# Patient Record
Sex: Male | Born: 1960 | Race: White | Hispanic: No | Marital: Married | Smoking: Former smoker
Health system: Southern US, Community
[De-identification: ages and names within clinical notes are randomized; demographics above are authoritative.]

## PROBLEM LIST (undated history)

## (undated) DIAGNOSIS — M109 Gout, unspecified: Secondary | ICD-10-CM

## (undated) DIAGNOSIS — E119 Type 2 diabetes mellitus without complications: Secondary | ICD-10-CM

## (undated) DIAGNOSIS — M1990 Unspecified osteoarthritis, unspecified site: Secondary | ICD-10-CM

## (undated) DIAGNOSIS — Z87442 Personal history of urinary calculi: Secondary | ICD-10-CM

## (undated) DIAGNOSIS — I10 Essential (primary) hypertension: Secondary | ICD-10-CM

## (undated) HISTORY — PX: HAND SURGERY: SHX662

## (undated) HISTORY — DX: Type 2 diabetes mellitus without complications: E11.9

## (undated) HISTORY — DX: Gout, unspecified: M10.9

## (undated) HISTORY — DX: Essential (primary) hypertension: I10

---

## 2000-04-01 ENCOUNTER — Encounter: Payer: Self-pay | Admitting: Orthopedic Surgery

## 2000-04-01 ENCOUNTER — Encounter: Payer: Self-pay | Admitting: Emergency Medicine

## 2000-04-01 ENCOUNTER — Emergency Department (HOSPITAL_COMMUNITY): Admission: EM | Admit: 2000-04-01 | Discharge: 2000-04-01 | Payer: Self-pay | Admitting: Emergency Medicine

## 2001-10-05 ENCOUNTER — Encounter: Payer: Self-pay | Admitting: Family Medicine

## 2001-10-05 ENCOUNTER — Ambulatory Visit (HOSPITAL_COMMUNITY): Admission: RE | Admit: 2001-10-05 | Discharge: 2001-10-05 | Payer: Self-pay | Admitting: Family Medicine

## 2014-09-01 ENCOUNTER — Telehealth: Payer: Self-pay | Admitting: Family Medicine

## 2014-09-01 NOTE — Telephone Encounter (Signed)
APPT MADE FOR CPE, PT WILL GO TO URGENT CARE TODAY FOR WEIGHT LOSS AND FATIGUE. NOT TAKING ANY MEDS.

## 2014-09-08 ENCOUNTER — Telehealth: Payer: Self-pay | Admitting: Family Medicine

## 2014-09-11 NOTE — Telephone Encounter (Signed)
STP he was dx'd with diabetes at the urgent care after losing 50 lbs in one year, they started him on metformin and wanted him to follow up with pcp, [facility]03/14 @ 2:30

## 2014-10-03 ENCOUNTER — Ambulatory Visit (INDEPENDENT_AMBULATORY_CARE_PROVIDER_SITE_OTHER): Payer: BC Managed Care – PPO | Admitting: Family Medicine

## 2014-10-03 ENCOUNTER — Encounter: Payer: Self-pay | Admitting: Family Medicine

## 2014-10-03 ENCOUNTER — Encounter (INDEPENDENT_AMBULATORY_CARE_PROVIDER_SITE_OTHER): Payer: Self-pay

## 2014-10-03 VITALS — BP 132/84 | HR 74 | Temp 97.9°F | Ht 65.0 in | Wt 191.4 lb

## 2014-10-03 DIAGNOSIS — E119 Type 2 diabetes mellitus without complications: Secondary | ICD-10-CM

## 2014-10-03 NOTE — Progress Notes (Signed)
   Subjective:    Patient ID: Antonio Fisher, male    DOB: August 13, 1961, 53 y.o.   MRN: [id number]  HPI 53 year old gentleman here to follow-up diagnosis of diabetes made at urgent care one month ago. He went to the urgent care with weight loss and was found to have an elevated sugar. Don't note any other labs that he had done that day but he was given a prescription for metformin. Over the previous year he had lost about 50 pounds weight and was having polyuria polydipsia along with visual changes. There is a positive family history in his father. Since starting on metformin he feels much better and has been observing lifestyle modifications including eating of sweets and reduced carbohydrates.    Review of Systems  Constitutional: Negative.   HENT: Negative.   Eyes: Negative.   Respiratory: Negative.  Negative for shortness of breath.   Cardiovascular: Negative.  Negative for chest pain and leg swelling.  Gastrointestinal: Negative.   Genitourinary: Negative.   Musculoskeletal: Negative.   Skin: Negative.   Neurological: Negative.   Psychiatric/Behavioral: Negative.   All other systems reviewed and are negative.      Objective:   Physical Exam  Constitutional: He is oriented to person, place, and time. He appears well-developed and well-nourished.  HENT:  Head: Normocephalic.  Right Ear: External ear normal.  Left Ear: External ear normal.  Nose: Nose normal.  Mouth/Throat: Oropharynx is clear and moist.  Eyes: Conjunctivae and EOM are normal. Pupils are equal, round, and reactive to light.  Neck: Normal range of motion. Neck supple.  Cardiovascular: Normal rate, regular rhythm, normal heart sounds and intact distal pulses.   Pulmonary/Chest: Effort normal and breath sounds normal.  Abdominal: Soft. Bowel sounds are normal.  Musculoskeletal: Normal range of motion.  Neurological: He is alert and oriented to person, place, and time.  Skin: Skin is warm and dry.    Psychiatric: He has a normal mood and affect. His behavior is normal. Judgment and thought content normal.    BP 132/84 mmHg  Pulse 74  Temp(Src) 97.9 F (36.6 C) (Oral)  Ht 5\' 5"  (1.651 m)  Wt 191 lb 6.4 oz (86.818 kg)  BMI 31.85 kg/m2      Assessment & Plan:  1. Type 2 diabetes mellitus without complication     Given above sx, I suspect his diagnosis is accurate.  He seems motivates to treat.  Gave info on food and glucometer; do not want to overwhelm him with i nfo but will gradually introduce more education.  He is to return end of Dec for physocal' need A1C, renal function, lipids then  Frederica KusterStephen M Joelyn Lover MD

## 2014-10-03 NOTE — Patient Instructions (Signed)
Diabetes Mellitus and Food It is important for you to manage your blood sugar (glucose) level. Your blood glucose level can be greatly affected by what you eat. Eating healthier foods in the appropriate amounts throughout the day at about the same time each day will help you control your blood glucose level. It can also help slow or prevent worsening of your diabetes mellitus. Healthy eating may even help you improve the level of your blood pressure and reach or maintain a healthy weight.  HOW CAN FOOD AFFECT ME? Carbohydrates Carbohydrates affect your blood glucose level more than any other type of food. Your dietitian will help you determine how many carbohydrates to eat at each meal and teach you how to count carbohydrates. Counting carbohydrates is important to keep your blood glucose at a healthy level, especially if you are using insulin or taking certain medicines for diabetes mellitus. Alcohol Alcohol can cause sudden decreases in blood glucose (hypoglycemia), especially if you use insulin or take certain medicines for diabetes mellitus. Hypoglycemia can be a life-threatening condition. Symptoms of hypoglycemia (sleepiness, dizziness, and disorientation) are similar to symptoms of having too much alcohol.  If your health care provider has given you approval to drink alcohol, do so in moderation and use the following guidelines:  Women should not have more than one drink per day, and men should not have more than two drinks per day. One drink is equal to:  12 oz of beer.  5 oz of wine.  1 oz of hard liquor.  Do not drink on an empty stomach.  Keep yourself hydrated. Have water, diet soda, or unsweetened iced tea.  Regular soda, juice, and other mixers might contain a lot of carbohydrates and should be counted. WHAT FOODS ARE NOT RECOMMENDED? As you make food choices, it is important to remember that all foods are not the same. Some foods have fewer nutrients per serving than other  foods, even though they might have the same number of calories or carbohydrates. It is difficult to get your body what it needs when you eat foods with fewer nutrients. Examples of foods that you should avoid that are high in calories and carbohydrates but low in nutrients include:  Trans fats (most processed foods list trans fats on the Nutrition Facts label).  Regular soda.  Juice.  Candy.  Sweets, such as cake, pie, doughnuts, and cookies.  Fried foods. WHAT FOODS CAN I EAT? Have nutrient-rich foods, which will nourish your body and keep you healthy. The food you should eat also will depend on several factors, including:  The calories you need.  The medicines you take.  Your weight.  Your blood glucose level.  Your blood pressure level.  Your cholesterol level. You also should eat a variety of foods, including:  Protein, such as meat, poultry, fish, tofu, nuts, and seeds (lean animal proteins are best).  Fruits.  Vegetables.  Dairy products, such as milk, cheese, and yogurt (low fat is best).  Breads, grains, pasta, cereal, rice, and beans.  Fats such as olive oil, trans fat-free margarine, canola oil, avocado, and olives. DOES EVERYONE WITH DIABETES MELLITUS HAVE THE SAME MEAL PLAN? Because every person with diabetes mellitus is different, there is not one meal plan that works for everyone. It is very important that you meet with a dietitian who will help you create a meal plan that is just right for you. Document Released: 07/28/2005 Document Revised: 11/05/2013 Document Reviewed: 09/27/2013 Exit[employer] Patient Information 2015 [employer]. This   information is not intended to replace advice given to you by your health care provider. Make sure you discuss any questions you have with your health care provider.  

## 2014-11-10 ENCOUNTER — Other Ambulatory Visit (INDEPENDENT_AMBULATORY_CARE_PROVIDER_SITE_OTHER): Payer: BC Managed Care – PPO

## 2014-11-10 DIAGNOSIS — E119 Type 2 diabetes mellitus without complications: Secondary | ICD-10-CM

## 2014-11-10 LAB — POCT GLYCOSYLATED HEMOGLOBIN (HGB A1C): Hemoglobin A1C: 8.6

## 2014-11-10 NOTE — Progress Notes (Signed)
Lab only 

## 2014-11-11 LAB — CMP14+EGFR
ALT: 21 IU/L (ref 0–44)
AST: 18 IU/L (ref 0–40)
Albumin/Globulin Ratio: 2.3 (ref 1.1–2.5)
Albumin: 4.3 g/dL (ref 3.5–5.5)
Alkaline Phosphatase: 66 IU/L (ref 39–117)
BUN/Creatinine Ratio: 17 (ref 9–20)
BUN: 13 mg/dL (ref 6–24)
CO2: 23 mmol/L (ref 18–29)
Calcium: 9.1 mg/dL (ref 8.7–10.2)
Chloride: 99 mmol/L (ref 97–108)
Creatinine, Ser: 0.75 mg/dL — ABNORMAL LOW (ref 0.76–1.27)
GFR calc Af Amer: 122 mL/min/{1.73_m2} (ref >59)
GFR calc non Af Amer: 105 mL/min/{1.73_m2} (ref >59)
Globulin, Total: 1.9 g/dL (ref 1.5–4.5)
Glucose: 248 mg/dL — ABNORMAL HIGH (ref 65–99)
Potassium: 4.3 mmol/L (ref 3.5–5.2)
Sodium: 140 mmol/L (ref 134–144)
Total Bilirubin: 0.7 mg/dL (ref 0.0–1.2)
Total Protein: 6.2 g/dL (ref 6.0–8.5)

## 2014-11-12 ENCOUNTER — Ambulatory Visit (INDEPENDENT_AMBULATORY_CARE_PROVIDER_SITE_OTHER): Payer: BC Managed Care – PPO | Admitting: Family Medicine

## 2014-11-12 ENCOUNTER — Encounter: Payer: Self-pay | Admitting: Family Medicine

## 2014-11-12 VITALS — BP 137/80 | HR 74 | Temp 97.1°F | Ht 65.0 in | Wt 195.4 lb

## 2014-11-12 DIAGNOSIS — E119 Type 2 diabetes mellitus without complications: Secondary | ICD-10-CM | POA: Insufficient documentation

## 2014-11-12 MED ORDER — SILDENAFIL CITRATE 20 MG PO TABS
20.0000 mg | ORAL_TABLET | Freq: Three times a day (TID) | ORAL | Status: DC
Start: 2014-11-12 — End: 2014-11-13

## 2014-11-12 MED ORDER — METFORMIN HCL 1000 MG PO TABS
1000.0000 mg | ORAL_TABLET | Freq: Two times a day (BID) | ORAL | Status: DC
Start: 2014-11-12 — End: 2015-03-03

## 2014-11-12 NOTE — Progress Notes (Signed)
   Subjective:    Patient ID: Antonio Fisher, male    DOB: 08/16/1961, 53 y.o.   MRN: [id number]  HPI 53 year old gentleman here to follow-up diabetes. He was diagnosed at urgent care several months ago and his first visit here was little over one month ago area he was given information about diet and started on metformin 500 mg twice a day. A1c was 8.6 and fasting glucose was 248 otherwise his renal function liver functions and electrolytes were all normal. I really think he is taking the diabetes diagnosis seriously and is trying to diet and watches foods. His weight has not changed since he lost a great deal of weight prior to the diagnosis. He does have an issue today with erectile dysfunction and we talked about that as relates to diabetes and vascular complications.    Review of Systems  Constitutional: Negative.   HENT: Negative.   Eyes: Negative.   Respiratory: Negative.  Negative for shortness of breath.   Cardiovascular: Negative.  Negative for chest pain and leg swelling.  Gastrointestinal: Negative.   Genitourinary: Negative.   Musculoskeletal: Negative.   Skin: Negative.   Neurological: Negative.   Psychiatric/Behavioral: Negative.   All other systems reviewed and are negative.      Objective:   Physical Exam  Constitutional: He is oriented to person, place, and time. He appears well-developed and well-nourished.  HENT:  Head: Normocephalic.  Right Ear: External ear normal.  Left Ear: External ear normal.  Nose: Nose normal.  Mouth/Throat: Oropharynx is clear and moist.  Eyes: Conjunctivae and EOM are normal. Pupils are equal, round, and reactive to light.  Neck: Normal range of motion. Neck supple.  Cardiovascular: Normal rate, regular rhythm, normal heart sounds and intact distal pulses.   Pulmonary/Chest: Effort normal and breath sounds normal.  Abdominal: Soft. Bowel sounds are normal.  Genitourinary: Prostate normal.  Musculoskeletal: Normal range of  motion.  Neurological: He is alert and oriented to person, place, and time.  Skin: Skin is warm and dry.  Psychiatric: He has a normal mood and affect. His behavior is normal. Judgment and thought content normal.          Assessment & Plan:  1. Type 2 diabetes mellitus without complication Given more education today about DM.  Increase metformin to 1000 mg BID 2. ED  Rx: Sildenafil 20 mg  Frederica KusterStephen M Miller MD

## 2014-11-12 NOTE — Patient Instructions (Signed)
Blood Glucose Monitoring °Monitoring your blood glucose (also know as blood sugar) helps you to manage your diabetes. It also helps you and your health care provider monitor your diabetes and determine how well your treatment plan is working. °WHY SHOULD YOU MONITOR YOUR BLOOD GLUCOSE? °· It can help you understand how food, exercise, and medicine affect your blood glucose. °· It allows you to know what your blood glucose is at any given moment. You can quickly tell if you are having low blood glucose (hypoglycemia) or high blood glucose (hyperglycemia). °· It can help you and your health care provider know how to adjust your medicines. °· It can help you understand how to manage an illness or adjust medicine for exercise. °WHEN SHOULD YOU TEST? °Your health care provider will help you decide how often you should check your blood glucose. This may depend on the type of diabetes you have, your diabetes control, or the types of medicines you are taking. Be sure to write down all of your blood glucose readings so that this information can be reviewed with your health care provider. See below for examples of testing times that your health care provider may suggest. °Type 1 Diabetes °· Test 4 times a day if you are in good control, using an insulin pump, or perform multiple daily injections. °· If your diabetes is not well controlled or if you are sick, you may need to monitor more often. °· It is a good idea to also monitor: °¨ Before and after exercise. °¨ Between meals and 2 hours after a meal. °¨ Occasionally between 2:00 a.m. and 3:00 a.m. °Type 2 Diabetes °· It can vary with each person, but generally, if you are on insulin, test 4 times a day. °· If you take medicines by mouth (orally), test 2 times a day. °· If you are on a controlled diet, test once a day. °· If your diabetes is not well controlled or if you are sick, you may need to monitor more often. °HOW TO MONITOR YOUR BLOOD GLUCOSE °Supplies  Needed °· Blood glucose meter. °· Test strips for your meter. Each meter has its own strips. You must use the strips that go with your own meter. °· A pricking needle (lancet). °· A device that holds the lancet (lancing device). °· A journal or log book to write down your results. °Procedure °· Wash your hands with soap and water. Alcohol is not preferred. °· Prick the side of your finger (not the tip) with the lancet. °· Gently milk the finger until a small drop of blood appears. °· Follow the instructions that come with your meter for inserting the test strip, applying blood to the strip, and using your blood glucose meter. °Other Areas to Get Blood for Testing °Some meters allow you to use other areas of your body (other than your finger) to test your blood. These areas are called alternative sites. The most common alternative sites are: °· The forearm. °· The thigh. °· The back area of the lower leg. °· The palm of the hand. °The blood flow in these areas is slower. Therefore, the blood glucose values you get may be delayed, and the numbers are different from what you would get from your fingers. Do not use alternative sites if you think you are having hypoglycemia. Your reading will not be accurate. Always use a finger if you are having hypoglycemia. Also, if you cannot feel your lows (hypoglycemia unawareness), always use your fingers for your   blood glucose checks. °ADDITIONAL TIPS FOR GLUCOSE MONITORING °· Do not reuse lancets. °· Always carry your supplies with you. °· All blood glucose meters have a 24-hour "hotline" number to call if you have questions or need help. °· Adjust (calibrate) your blood glucose meter with a control solution after finishing a few boxes of strips. °BLOOD GLUCOSE RECORD KEEPING °It is a good idea to keep a daily record or log of your blood glucose readings. Most glucose meters, if not all, keep your glucose records stored in the meter. Some meters come with the ability to download  your records to your home computer. Keeping a record of your blood glucose readings is especially helpful if you are wanting to look for patterns. Make notes to go along with the blood glucose readings because you might forget what happened at that exact time. Keeping good records helps you and your health care provider to work together to achieve good diabetes management.  °Document Released: 11/03/2003 Document Revised: 03/17/2014 Document Reviewed: 03/25/2013 °ExitCare® Patient Information ©2015 [employer]. This information is not intended to replace advice given to you by your health care provider. Make sure you discuss any questions you have with your health care provider. ° °Diabetes and Exercise °Exercising regularly is important. It is not just about losing weight. It has many health benefits, such as: °· Improving your overall fitness, flexibility, and endurance. °· Increasing your bone density. °· Helping with weight control. °· Decreasing your body fat. °· Increasing your muscle strength. °· Reducing stress and tension. °· Improving your overall health. °People with diabetes who exercise gain additional benefits because exercise: °· Reduces appetite. °· Improves the body's use of blood sugar (glucose). °· Helps lower or control blood glucose. °· Decreases blood pressure. °· Helps control blood lipids (such as cholesterol and triglycerides). °· Improves the body's use of the hormone insulin by: °¨ Increasing the body's insulin sensitivity. °¨ Reducing the body's insulin needs. °· Decreases the risk for heart disease because exercising: °¨ Lowers cholesterol and triglycerides levels. °¨ Increases the levels of good cholesterol (such as high-density lipoproteins [HDL]) in the body. °¨ Lowers blood glucose levels. °YOUR ACTIVITY PLAN  °Choose an activity that you enjoy and set realistic goals. Your health care provider or diabetes educator can help you make an activity plan that works for you. Exercise  regularly as directed by your health care provider. This includes: °· Performing resistance training twice a week such as push-ups, sit-ups, lifting weights, or using resistance bands. °· Performing 150 minutes of cardio exercises each week such as walking, running, or playing sports. °· Staying active and spending no more than 90 minutes at one time being inactive. °Even short bursts of exercise are good for you. Three 10-minute sessions spread throughout the day are just as beneficial as a single 30-minute session. Some exercise ideas include: °· Taking the dog for a walk. °· Taking the stairs instead of the elevator. °· Dancing to your favorite song. °· Doing an exercise video. °· Doing your favorite exercise with a friend. °RECOMMENDATIONS FOR EXERCISING WITH TYPE 1 OR TYPE 2 DIABETES  °· Check your blood glucose before exercising. If blood glucose levels are greater than 240 mg/dL, check for urine ketones. Do not exercise if ketones are present. °· Avoid injecting insulin into areas of the body that are going to be exercised. For example, avoid injecting insulin into: °¨ The arms when playing tennis. °¨ The legs when jogging. °· Keep a record of: °¨   Food intake before and after you exercise. °¨ Expected peak times of insulin action. °¨ Blood glucose levels before and after you exercise. °¨ The type and amount of exercise you have done. °· Review your records with your health care provider. Your health care provider will help you to develop guidelines for adjusting food intake and insulin amounts before and after exercising. °· If you take insulin or oral hypoglycemic agents, watch for signs and symptoms of hypoglycemia. They include: °¨ Dizziness. °¨ Shaking. °¨ Sweating. °¨ Chills. °¨ Confusion. °· Drink plenty of water while you exercise to prevent dehydration or heat stroke. Body water is lost during exercise and must be replaced. °· Talk to your health care provider before starting an exercise program to  make sure it is safe for you. Remember, almost any type of activity is better than none. °Document Released: 01/21/2004 Document Revised: 03/17/2014 Document Reviewed: 04/09/2013 °ExitCare® Patient Information ©2015 [employer]. This information is not intended to replace advice given to you by your health care provider. Make sure you discuss any questions you have with your health care provider. ° °

## 2014-11-13 ENCOUNTER — Other Ambulatory Visit: Payer: Self-pay | Admitting: *Deleted

## 2014-11-13 MED ORDER — SILDENAFIL CITRATE 20 MG PO TABS: ORAL_TABLET | ORAL | Status: DC

## 2014-11-21 ENCOUNTER — Telehealth: Payer: Self-pay | Admitting: Family Medicine

## 2014-11-21 NOTE — Telephone Encounter (Signed)
Wife decided to call pharmacy first.

## 2014-12-02 ENCOUNTER — Encounter: Payer: Self-pay | Admitting: *Deleted

## 2015-01-09 ENCOUNTER — Telehealth: Payer: Self-pay | Admitting: *Deleted

## 2015-01-09 NOTE — Telephone Encounter (Signed)
Dr. Hyacinth MeekerMiller as we discussed ins co denied sildenafil on Cecilio and you wished me to do an appeal, they said that sidenafil is only used for  Pulmonary hypertension, would we have a better shot with viagra or cialis? I for sure don't know what I'm talking about but just a thought will do what you advise.  Thanks!

## 2015-02-13 ENCOUNTER — Other Ambulatory Visit (INDEPENDENT_AMBULATORY_CARE_PROVIDER_SITE_OTHER): Payer: BLUE CROSS/BLUE SHIELD

## 2015-02-13 DIAGNOSIS — E119 Type 2 diabetes mellitus without complications: Secondary | ICD-10-CM

## 2015-02-13 LAB — POCT GLYCOSYLATED HEMOGLOBIN (HGB A1C): Hemoglobin A1C: 8.1

## 2015-02-13 NOTE — Progress Notes (Signed)
Lab only 

## 2015-02-14 LAB — LIPID PANEL
CHOL/HDL RATIO: 5.9 ratio — AB (ref 0.0–5.0)
CHOLESTEROL TOTAL: 208 mg/dL — AB (ref 100–199)
HDL: 35 mg/dL — AB (ref >39)
Triglycerides: 451 mg/dL — ABNORMAL HIGH (ref 0–149)

## 2015-02-14 LAB — CMP14+EGFR
ALT: 25 IU/L (ref 0–44)
AST: 18 IU/L (ref 0–40)
Albumin/Globulin Ratio: 1.8 (ref 1.1–2.5)
Albumin: 4.4 g/dL (ref 3.5–5.5)
Alkaline Phosphatase: 67 IU/L (ref 39–117)
BUN/Creatinine Ratio: 15 (ref 9–20)
BUN: 11 mg/dL (ref 6–24)
Bilirubin Total: 0.4 mg/dL (ref 0.0–1.2)
CO2: 25 mmol/L (ref 18–29)
Calcium: 9.6 mg/dL (ref 8.7–10.2)
Chloride: 101 mmol/L (ref 97–108)
Creatinine, Ser: 0.71 mg/dL — ABNORMAL LOW (ref 0.76–1.27)
GFR calc Af Amer: 125 mL/min/{1.73_m2} (ref >59)
GFR calc non Af Amer: 108 mL/min/{1.73_m2} (ref >59)
Globulin, Total: 2.4 g/dL (ref 1.5–4.5)
Glucose: 225 mg/dL — ABNORMAL HIGH (ref 65–99)
Potassium: 4.5 mmol/L (ref 3.5–5.2)
Sodium: 142 mmol/L (ref 134–144)
Total Protein: 6.8 g/dL (ref 6.0–8.5)

## 2015-02-17 ENCOUNTER — Encounter: Payer: Self-pay | Admitting: Family Medicine

## 2015-02-17 ENCOUNTER — Ambulatory Visit (INDEPENDENT_AMBULATORY_CARE_PROVIDER_SITE_OTHER): Payer: BLUE CROSS/BLUE SHIELD | Admitting: Family Medicine

## 2015-02-17 VITALS — BP 126/85 | HR 90 | Temp 97.6°F | Ht 65.0 in | Wt 188.0 lb

## 2015-02-17 DIAGNOSIS — E119 Type 2 diabetes mellitus without complications: Secondary | ICD-10-CM

## 2015-02-17 LAB — POCT UA - MICROALBUMIN: Microalbumin Ur, POC: NEGATIVE mg/L

## 2015-02-17 MED ORDER — SILDENAFIL CITRATE 100 MG PO TABS: ORAL_TABLET | ORAL | Status: DC

## 2015-02-17 MED ORDER — GLIMEPIRIDE 4 MG PO TABS: 4.0000 mg | ORAL_TABLET | Freq: Every day | ORAL | Status: DC

## 2015-02-17 NOTE — Progress Notes (Signed)
   Subjective:    Patient ID: Antonio Fisher, male    DOB: 1961/07/19, 54 y.o.   MRN: [id number]  HPI 54 year old gentleman here to follow-up diabetes and erectile dysfunction. Currently takes metformin 1000 mg twice a day. That will was increased at his last visit and his A1c has decreased from 8.6-8.1. He tells me he is doing better in terms of his dietary compliance and avoidance of carbohydrates.  At his last visit I had ordered generic sildenafil for erectile dysfunction. He did not think this was effective and is requesting trade name Viagra or Cialis  Patient Active Problem List   Diagnosis Date Noted  . Type 2 diabetes mellitus 11/12/2014   Outpatient Encounter Prescriptions as of 02/17/2015  Medication Sig  . metFORMIN (GLUCOPHAGE) 1000 MG tablet Take 1 tablet (1,000 mg total) by mouth 2 (two) times daily with a meal.  . sildenafil (REVATIO) 20 MG tablet Take one tab po PRN prior to sexual activity      Review of Systems  Constitutional: Negative.   HENT: Negative.   Respiratory: Negative.   Cardiovascular: Negative.   Endocrine: Positive for polyuria.  Musculoskeletal: Negative.   Neurological: Negative.   Psychiatric/Behavioral: Negative.        Objective:   Physical Exam  Constitutional: He is oriented to person, place, and time. He appears well-developed and well-nourished.  HENT:  Head: Normocephalic.  Right Ear: External ear normal.  Left Ear: External ear normal.  Nose: Nose normal.  Mouth/Throat: Oropharynx is clear and moist.  Eyes: Conjunctivae and EOM are normal. Pupils are equal, round, and reactive to light.  Neck: Normal range of motion. Neck supple.  Cardiovascular: Normal rate, regular rhythm, normal heart sounds and intact distal pulses.   Pulmonary/Chest: Effort normal and breath sounds normal.  Abdominal: Soft. Bowel sounds are normal.  Musculoskeletal: Normal range of motion.  Neurological: He is alert and oriented to person, place, and time.   Skin: Skin is warm and dry.  Psychiatric: He has a normal mood and affect. His behavior is normal. Judgment and thought content normal.     BP 126/85 mmHg  Pulse 90  Temp(Src) 97.6 F (36.4 C) (Oral)  Ht 5\' 5"  (1.651 m)  Wt 188 lb (85.276 kg)  BMI 31.28 kg/m2         Assessment & Plan:  1. Type 2 diabetes mellitus without complication Will add Amaryl 4 mg to current regimen of metformin 2000 mg a day. Will repeat A1c in 3 months. Also noted on his lab work that he had done several days prior to this visit triglycerides were markedly elevated. I suggested he take at least 2 g of facial and watch fat intake he may well need 5 bright  Frederica KusterStephen M Miller MD - POCT [facility] - Microalbumin

## 2015-02-18 ENCOUNTER — Telehealth: Payer: Self-pay | Admitting: Family Medicine

## 2015-02-19 ENCOUNTER — Ambulatory Visit: Payer: BC Managed Care – PPO | Admitting: Family Medicine

## 2015-02-19 ENCOUNTER — Telehealth: Payer: Self-pay | Admitting: *Deleted

## 2015-02-19 NOTE — Telephone Encounter (Signed)
Pt notified of results

## 2015-02-19 NOTE — Telephone Encounter (Signed)
Insurance will not cover viagra and will only cover cialsis can you please change

## 2015-02-19 NOTE — Telephone Encounter (Signed)
-----   Message from Frederica KusterStephen M Miller, MD sent at 02/18/2015 12:51 PM EDT ----- Patient is aware of A1c result in the new medication was ordered blood chemistries are normal except for elevated glucose

## 2015-02-20 MED ORDER — TADALAFIL 10 MG PO TABS: 10.0000 mg | ORAL_TABLET | ORAL | Status: DC | PRN

## 2015-02-20 NOTE — Telephone Encounter (Signed)
Left detailed message that [facility]

## 2015-02-20 NOTE — Telephone Encounter (Signed)
Okay to change to Cialis

## 2015-03-03 ENCOUNTER — Telehealth: Payer: Self-pay | Admitting: Family Medicine

## 2015-03-03 MED ORDER — METFORMIN HCL 1000 MG PO TABS: 1000.0000 mg | ORAL_TABLET | Freq: Two times a day (BID) | ORAL | Status: DC

## 2015-03-03 NOTE — Telephone Encounter (Signed)
done

## 2015-04-27 ENCOUNTER — Telehealth: Payer: Self-pay | Admitting: *Deleted

## 2015-04-27 MED ORDER — GLIMEPIRIDE 4 MG PO TABS: 4.0000 mg | ORAL_TABLET | Freq: Every day | ORAL | Status: DC

## 2015-04-27 NOTE — Telephone Encounter (Signed)
Wait until Wednesday

## 2015-04-27 NOTE — Telephone Encounter (Signed)
Received fax from [facility] requesting a refill on pts indomethacin 50. Do not see on patients current med list. Please advise. Patient of Dr Hyacinth Meeker and he is not back in office till Wednesday. Please route to [facility]

## 2015-04-27 NOTE — Telephone Encounter (Signed)
Dr Hyacinth Meeker please review and route to [facility]

## 2015-04-29 ENCOUNTER — Other Ambulatory Visit: Payer: Self-pay

## 2015-04-29 NOTE — Telephone Encounter (Signed)
Last seen 02/17/15 Dr Hyacinth Meeker  This med not on EPIC list

## 2015-04-30 MED ORDER — INDOMETHACIN 50 MG PO CAPS: ORAL_CAPSULE | ORAL | Status: DC

## 2015-06-16 ENCOUNTER — Other Ambulatory Visit: Payer: Self-pay | Admitting: Family Medicine

## 2015-07-16 ENCOUNTER — Ambulatory Visit (INDEPENDENT_AMBULATORY_CARE_PROVIDER_SITE_OTHER): Payer: BLUE CROSS/BLUE SHIELD | Admitting: Family Medicine

## 2015-07-16 ENCOUNTER — Encounter (INDEPENDENT_AMBULATORY_CARE_PROVIDER_SITE_OTHER): Payer: Self-pay

## 2015-07-16 ENCOUNTER — Encounter: Payer: Self-pay | Admitting: Family Medicine

## 2015-07-16 VITALS — BP 130/83 | HR 63 | Temp 97.2°F | Ht 65.0 in | Wt 198.2 lb

## 2015-07-16 DIAGNOSIS — E119 Type 2 diabetes mellitus without complications: Secondary | ICD-10-CM | POA: Diagnosis not present

## 2015-07-16 LAB — POCT GLYCOSYLATED HEMOGLOBIN (HGB A1C): Hemoglobin A1C: 5.6

## 2015-07-16 MED ORDER — GLIMEPIRIDE 4 MG PO TABS: ORAL_TABLET | ORAL | Status: DC

## 2015-07-16 MED ORDER — LISINOPRIL 5 MG PO TABS: 5.0000 mg | ORAL_TABLET | Freq: Every day | ORAL | Status: DC

## 2015-07-16 MED ORDER — METFORMIN HCL 1000 MG PO TABS: ORAL_TABLET | ORAL | Status: DC

## 2015-07-16 NOTE — Progress Notes (Signed)
   Subjective:    Patient ID: Antonio Fisher, male    DOB: Jun 01, 1961, 54 y.o.   MRN: [id number]  HPI 54 year old gentleman who is here to follow-up diabetes. He has gained 10 pounds since his last visit 4 months ago. He tends to like ice cream. Last A1c was 8.1. Medications include metformin and glimepiride. He has not been on an ACE inhibitor for renal protection. Her pressures of been generally less than 130 systolic. He has no other complaints or symptoms today  Patient Active Problem List   Diagnosis Date Noted  . Type 2 diabetes mellitus 11/12/2014   Outpatient Encounter Prescriptions as of 07/16/2015  Medication Sig  . glimepiride (AMARYL) 4 MG tablet TAKE 1 TABLET (4 MG TOTAL) BY MOUTH DAILY BEFORE BREAKFAST.  . metFORMIN (GLUCOPHAGE) 1000 MG tablet TAKE 1 TABLET (1,000 MG TOTAL) BY MOUTH 2 (TWO) TIMES DAILY WITH A MEA L.  . indomethacin (INDOCIN) 50 MG capsule Take one capsule three times daily (Patient not taking: Reported on 07/16/2015)  . tadalafil (CIALIS) 10 MG tablet Take 1 tablet (10 mg total) by mouth every other day as needed for erectile dysfunction. (Patient not taking: Reported on 07/16/2015)  . [DISCONTINUED] sildenafil (REVATIO) 20 MG tablet Take one tab po PRN prior to sexual activity   No facility-administered encounter medications on file as of 07/16/2015.      Review of Systems  Constitutional: Negative.   HENT: Negative.   Eyes: Negative.   Respiratory: Negative.  Negative for shortness of breath.   Cardiovascular: Negative.  Negative for chest pain and leg swelling.  Gastrointestinal: Negative.   Genitourinary: Negative.   Musculoskeletal: Negative.   Skin: Negative.   Neurological: Negative.   Psychiatric/Behavioral: Negative.   All other systems reviewed and are negative.      Objective:   Physical Exam  Constitutional: He is oriented to person, place, and time. He appears well-developed and well-nourished.  Cardiovascular: Normal rate, regular rhythm  and intact distal pulses.   Pulmonary/Chest: Effort normal and breath sounds normal.  Neurological: He is alert and oriented to person, place, and time.  Skin: No rash noted.  Psychiatric: He has a normal mood and affect. His behavior is normal.          Assessment & Plan:  1. Type 2 diabetes mellitus without complication A1c is pending. I will add lisinopril 5 mg to his regimen for renal protection. Triglycerides are elevated back in April and could not calculate LDL but that would be an important thing to know in case we need to lower her lipids. - POCT glycosylated hemoglobin (Hb A1C)  Frederica Kuster MD - Lipid panel

## 2015-07-17 LAB — LIPID PANEL
CHOL/HDL RATIO: 4.5 ratio (ref 0.0–5.0)
Cholesterol, Total: 177 mg/dL (ref 100–199)
HDL: 39 mg/dL — AB (ref >39)
LDL Calculated: 117 mg/dL — ABNORMAL HIGH (ref 0–99)
TRIGLYCERIDES: 104 mg/dL (ref 0–149)
VLDL CHOLESTEROL CAL: 21 mg/dL (ref 5–40)

## 2015-07-21 NOTE — Progress Notes (Signed)
Patient's wife informed, states he is taking three fish oil a day now, so she will have him increase this to two in the morning and two in the evening.

## 2015-07-31 ENCOUNTER — Encounter: Payer: Self-pay | Admitting: Pediatrics

## 2015-07-31 ENCOUNTER — Ambulatory Visit (INDEPENDENT_AMBULATORY_CARE_PROVIDER_SITE_OTHER): Payer: BLUE CROSS/BLUE SHIELD | Admitting: Pediatrics

## 2015-07-31 VITALS — BP 132/86 | HR 71 | Temp 97.7°F | Ht 65.0 in | Wt 190.8 lb

## 2015-07-31 DIAGNOSIS — Z0000 Encounter for general adult medical examination without abnormal findings: Secondary | ICD-10-CM

## 2015-07-31 DIAGNOSIS — J069 Acute upper respiratory infection, unspecified: Secondary | ICD-10-CM | POA: Diagnosis not present

## 2015-07-31 DIAGNOSIS — J309 Allergic rhinitis, unspecified: Secondary | ICD-10-CM

## 2015-07-31 DIAGNOSIS — B9789 Other viral agents as the cause of diseases classified elsewhere: Principal | ICD-10-CM

## 2015-07-31 MED ORDER — FLUTICASONE PROPIONATE 50 MCG/ACT NA SUSP: 2.0000 | Freq: Every day | NASAL | Status: DC

## 2015-07-31 NOTE — Patient Instructions (Addendum)
--   ibuprofen  to  (3 or 4 tabs) every 8 hours for next five days -- flonase nasal spray one spray in each nostril morning and night -- can try guaiafenisin (mucinex) for the runny nose as well, will help dry things up.  -- allergy medicine cetirizine  you can also try once a day every day for allergies if you need more

## 2015-07-31 NOTE — Progress Notes (Signed)
Subjective:    Patient ID: Antonio Fisher, male    DOB: 02-20-1961, 54 y.o.   MRN: [id number]  CC: cough  HPI: Antonio Fisher is a 54 y.o. male presenting on 07/31/2015 for Cough  Started feeling ill 5 days ago, had to spend the day in bed a couple of days ago. First thought allergies. Eyes running, nose running, cough going for past day.  Not eating much because no appetite. No fevers. Does have muscle aches off and on.   Bailed hay for last two weeks. Feels better today. Ate some today for first time. Vomited once after eating oodles of noodles two weeks ago. No abd pain throughout any of this. Still coughing. Taking claritin, also taking a OTC cough syrup. Cough was keeping him up, OTC cough syrup helped.    Relevant past medical, surgical, family and social history reviewed and updated as indicated. Interim medical history since our last visit reviewed. Allergies and medications reviewed and updated.   ROS: Per HPI unless specifically indicated above  Past Medical History Patient Active Problem List   Diagnosis Date Noted  . Type 2 diabetes mellitus 11/12/2014    Current Outpatient Prescriptions  Medication Sig Dispense Refill  . glimepiride (AMARYL) 4 MG tablet TAKE 1 TABLET (4 MG TOTAL) BY MOUTH DAILY BEFORE BREAKFAST. 30 tablet 0  . indomethacin (INDOCIN) 50 MG capsule Take one capsule three times daily 30 capsule 1  . lisinopril (PRINIVIL,ZESTRIL) 5 MG tablet Take 1 tablet (5 mg total) by mouth daily. 90 tablet 3  . metFORMIN (GLUCOPHAGE) 1000 MG tablet TAKE 1 TABLET (1,000 MG TOTAL) BY MOUTH 2 (TWO) TIMES DAILY WITH A MEA L. 60 tablet 0  . tadalafil (CIALIS) 10 MG tablet Take 1 tablet (10 mg total) by mouth every other day as needed for erectile dysfunction. 10 tablet 0  . fluticasone (FLONASE) 50 MCG/ACT nasal spray Place 2 sprays into both nostrils daily. 16 g 6   No current facility-administered medications for this visit.       Objective:    BP 132/86 mmHg   Pulse 71  Temp(Src) 97.7 F (36.5 C) (Oral)  Ht  (1.651 m)  Wt 190 lb 12.8 oz (86.546 kg)  BMI 31.75 kg/m2  Wt Readings from Last 3 Encounters:  07/31/15 190 lb 12.8 oz (86.546 kg)  07/16/15 198 lb 3.2 oz (89.903 kg)  02/17/15 188 lb (85.276 kg)     Gen: NAD, alert, cooperative with exam, NCAT EYES: EOMI, no scleral injection or icterus ENT:  TMs pearly gray b/l, OP with some erythema. Clear nasal discharge present LYMPH: no cervical LAD CV: NRRR, normal S1/S2, no murmur, DP pulses 2+ b/l Resp: CTABL, no wheezes, normal WOB Abd: +BS, soft, NTND. no guarding or organomegaly Ext: No edema, warm Neuro: Alert and oriented, strength equal b/l UE and LE, coordination grossly normal MSK: normal muscle bulk     Assessment & Plan:   Antonio Fisher was seen today for cough, initial symptoms sound most like allergies though he likely also has a viral URI given systemic symptoms. Today feeling better. Continue symptomatic care. Start flonase.  Diagnoses and all orders for this visit:  Allergic rhinitis, unspecified allergic rhinitis type -     fluticasone (FLONASE) 50 MCG/ACT nasal spray; Place 2 sprays into both nostrils daily.  Viral URI symptomatic care as above   Follow up plan: Return if symptoms worsen or fail to improve.  Rex Kras, MD [facility] 07/31/2015,  9:52 AM

## 2015-08-18 ENCOUNTER — Other Ambulatory Visit: Payer: Self-pay | Admitting: Family Medicine

## 2015-08-26 ENCOUNTER — Other Ambulatory Visit: Payer: Self-pay | Admitting: Family Medicine

## 2015-09-24 ENCOUNTER — Telehealth: Payer: Self-pay | Admitting: Family Medicine

## 2015-09-25 NOTE — Telephone Encounter (Signed)
Tried calling patient again to let them know the copies were ready to pick up. No answer on either phone.

## 2015-09-25 NOTE — Telephone Encounter (Signed)
Tried to call patient on home and cell# to let him know his copies of his labs were ready. No answer on either phone.

## 2015-10-21 ENCOUNTER — Telehealth: Payer: Self-pay | Admitting: Family Medicine

## 2015-10-22 ENCOUNTER — Telehealth: Payer: Self-pay | Admitting: Family Medicine

## 2015-10-22 NOTE — Telephone Encounter (Signed)
DENIED 

## 2015-10-22 NOTE — Telephone Encounter (Signed)
NOTED

## 2015-10-26 ENCOUNTER — Other Ambulatory Visit: Payer: Self-pay | Admitting: Pediatrics

## 2015-11-17 ENCOUNTER — Other Ambulatory Visit (INDEPENDENT_AMBULATORY_CARE_PROVIDER_SITE_OTHER): Payer: BLUE CROSS/BLUE SHIELD

## 2015-11-17 DIAGNOSIS — E119 Type 2 diabetes mellitus without complications: Secondary | ICD-10-CM

## 2015-11-17 LAB — POCT GLYCOSYLATED HEMOGLOBIN (HGB A1C): HEMOGLOBIN A1C: 5.9

## 2015-11-17 NOTE — Progress Notes (Signed)
lab only.

## 2015-11-18 LAB — CMP14+EGFR
A/G RATIO: 1.9 (ref 1.1–2.5)
ALBUMIN: 4.4 g/dL (ref 3.5–5.5)
ALT: 22 IU/L (ref 0–44)
AST: 16 IU/L (ref 0–40)
Alkaline Phosphatase: 57 IU/L (ref 39–117)
BUN / CREAT RATIO: 13 (ref 9–20)
BUN: 12 mg/dL (ref 6–24)
Bilirubin Total: 0.5 mg/dL (ref 0.0–1.2)
CO2: 26 mmol/L (ref 18–29)
Calcium: 9.4 mg/dL (ref 8.7–10.2)
Chloride: 100 mmol/L (ref 96–106)
Creatinine, Ser: 0.89 mg/dL (ref 0.76–1.27)
GFR, EST AFRICAN AMERICAN: 113 mL/min/{1.73_m2} (ref >59)
GFR, EST NON AFRICAN AMERICAN: 98 mL/min/{1.73_m2} (ref >59)
GLOBULIN, TOTAL: 2.3 g/dL (ref 1.5–4.5)
GLUCOSE: 154 mg/dL — AB (ref 65–99)
POTASSIUM: 4.6 mmol/L (ref 3.5–5.2)
SODIUM: 142 mmol/L (ref 134–144)
Total Protein: 6.7 g/dL (ref 6.0–8.5)

## 2015-11-18 LAB — LIPID PANEL
CHOL/HDL RATIO: 5.3 ratio — AB (ref 0.0–5.0)
Cholesterol, Total: 195 mg/dL (ref 100–199)
HDL: 37 mg/dL — ABNORMAL LOW (ref >39)
LDL CALC: 111 mg/dL — AB (ref 0–99)
Triglycerides: 237 mg/dL — ABNORMAL HIGH (ref 0–149)
VLDL Cholesterol Cal: 47 mg/dL — ABNORMAL HIGH (ref 5–40)

## 2015-11-24 ENCOUNTER — Ambulatory Visit (INDEPENDENT_AMBULATORY_CARE_PROVIDER_SITE_OTHER): Payer: BLUE CROSS/BLUE SHIELD | Admitting: Family Medicine

## 2015-11-24 ENCOUNTER — Encounter: Payer: Self-pay | Admitting: Family Medicine

## 2015-11-24 VITALS — BP 143/89 | HR 80 | Temp 98.1°F | Ht 65.0 in | Wt 208.4 lb

## 2015-11-24 DIAGNOSIS — E119 Type 2 diabetes mellitus without complications: Secondary | ICD-10-CM

## 2015-11-24 NOTE — Progress Notes (Signed)
   Subjective:    Patient ID: Hessie DienerBobby D Auletta, male    DOB: 13-Mar-1961, 55 y.o.   MRN: [id number]  HPI 55 year old gentleman is here to follow-up diabetes, hypertension, and hyperlipidemia. Except for weight gain in the last 4 months of a 17 pounds he has done well. He had labs done earlier this month which showed A1c of 5.9 and LDL of 111. Renal function showed a creatinine clearance of 83.5. 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 I really cannot explain this given the fact that his A1c's are very good unless he is having some fluctuations but the average is good. The other problem is erectile dysfunction. He has tried several of the drugs in the class of Viagra without good effects. I shared with him of the problem may be more psychological than circulatory and we may want to refer him.  Patient Active Problem List   Diagnosis Date Noted  . Rhinitis, allergic 07/31/2015  . Type 2 diabetes mellitus (HCC) 11/12/2014   Outpatient Encounter Prescriptions as of 11/24/2015  Medication Sig  . fluticasone (FLONASE) 50 MCG/ACT nasal spray Place 2 sprays into both nostrils daily.  Marland Kitchen. glimepiride (AMARYL) 4 MG tablet TAKE 1 TABLET (4 MG TOTAL) BY MOUTH DAILY BEFORE BREAKFAST.  . indomethacin (INDOCIN) 50 MG capsule Take one capsule three times daily  . lisinopril (PRINIVIL,ZESTRIL) 5 MG tablet Take 1 tablet (5 mg total) by mouth daily.  . metFORMIN (GLUCOPHAGE) 1000 MG tablet TAKE ONE TABLET BY MOUTH TWICE A DAY WITH MEALS  . tadalafil (CIALIS) 10 MG tablet Take 1 tablet (10 mg total) by mouth every other day as needed for erectile dysfunction.   No facility-administered encounter medications on file as of 11/24/2015.      Review of Systems  Constitutional: Negative.   Respiratory: Negative.   Cardiovascular: Negative.   Genitourinary: Negative.   Neurological: Negative.        Objective:   Physical Exam  Constitutional: He is  oriented to person, place, and time. He appears well-developed and well-nourished.  Cardiovascular: Normal rate, regular rhythm, normal heart sounds and intact distal pulses.   Pulmonary/Chest: Effort normal and breath sounds normal.  Neurological: He is alert and oriented to person, place, and time.          Assessment & Plan:  1. Type 2 diabetes mellitus without complication, without long-term current use of insulin (HCC) A1c is excellent at 5.9. Medications include metformin and glimepiride. Blood pressure is a little elevated today so will increase lisinopril from 5-10 mg. He had been taking it only for renal protection previously.  Frederica KusterStephen M Miller MD

## 2015-12-14 ENCOUNTER — Telehealth: Payer: Self-pay | Admitting: Family Medicine

## 2015-12-14 MED ORDER — LISINOPRIL 10 MG PO TABS: 10.0000 mg | ORAL_TABLET | Freq: Every day | ORAL | Status: DC

## 2015-12-14 NOTE — Telephone Encounter (Signed)
rx sent to pharmacy per last office visit notes. Pt is aware.

## 2016-01-08 ENCOUNTER — Other Ambulatory Visit: Payer: Self-pay | Admitting: Family Medicine

## 2016-01-08 MED ORDER — METFORMIN HCL 1000 MG PO TABS: 1000.0000 mg | ORAL_TABLET | Freq: Two times a day (BID) | ORAL | Status: DC

## 2016-01-08 NOTE — Telephone Encounter (Signed)
done

## 2016-01-17 ENCOUNTER — Other Ambulatory Visit: Payer: Self-pay | Admitting: Family Medicine

## 2016-02-24 ENCOUNTER — Telehealth: Payer: Self-pay | Admitting: Family Medicine

## 2016-03-18 ENCOUNTER — Other Ambulatory Visit: Payer: BLUE CROSS/BLUE SHIELD

## 2016-03-18 DIAGNOSIS — E119 Type 2 diabetes mellitus without complications: Secondary | ICD-10-CM

## 2016-03-18 LAB — BAYER DCA HB A1C WAIVED: HB A1C: 6.1 % (ref <7.0)

## 2016-03-19 LAB — CMP14+EGFR
ALBUMIN: 4.6 g/dL (ref 3.5–5.5)
ALT: 22 IU/L (ref 0–44)
AST: 17 IU/L (ref 0–40)
Albumin/Globulin Ratio: 2 (ref 1.2–2.2)
Alkaline Phosphatase: 54 IU/L (ref 39–117)
BUN / CREAT RATIO: 15 (ref 9–20)
BUN: 13 mg/dL (ref 6–24)
Bilirubin Total: 0.4 mg/dL (ref 0.0–1.2)
CALCIUM: 9.4 mg/dL (ref 8.7–10.2)
CO2: 25 mmol/L (ref 18–29)
CREATININE: 0.84 mg/dL (ref 0.76–1.27)
Chloride: 102 mmol/L (ref 96–106)
GFR, EST AFRICAN AMERICAN: 116 mL/min/{1.73_m2} (ref >59)
GFR, EST NON AFRICAN AMERICAN: 100 mL/min/{1.73_m2} (ref >59)
GLUCOSE: 181 mg/dL — AB (ref 65–99)
Globulin, Total: 2.3 g/dL (ref 1.5–4.5)
Potassium: 4.5 mmol/L (ref 3.5–5.2)
Sodium: 143 mmol/L (ref 134–144)
TOTAL PROTEIN: 6.9 g/dL (ref 6.0–8.5)

## 2016-03-19 LAB — LIPID PANEL
Chol/HDL Ratio: 5.4 ratio units — ABNORMAL HIGH (ref 0.0–5.0)
Cholesterol, Total: 195 mg/dL (ref 100–199)
HDL: 36 mg/dL — AB (ref >39)
LDL CALC: 122 mg/dL — AB (ref 0–99)
Triglycerides: 184 mg/dL — ABNORMAL HIGH (ref 0–149)
VLDL CHOLESTEROL CAL: 37 mg/dL (ref 5–40)

## 2016-03-24 ENCOUNTER — Encounter (INDEPENDENT_AMBULATORY_CARE_PROVIDER_SITE_OTHER): Payer: Self-pay

## 2016-03-25 ENCOUNTER — Ambulatory Visit (INDEPENDENT_AMBULATORY_CARE_PROVIDER_SITE_OTHER): Payer: BLUE CROSS/BLUE SHIELD | Admitting: Family Medicine

## 2016-03-25 ENCOUNTER — Encounter: Payer: Self-pay | Admitting: Family Medicine

## 2016-03-25 VITALS — BP 125/81 | HR 65 | Temp 97.4°F | Ht 65.0 in | Wt 206.0 lb

## 2016-03-25 DIAGNOSIS — E119 Type 2 diabetes mellitus without complications: Secondary | ICD-10-CM

## 2016-03-25 MED ORDER — GLIMEPIRIDE 4 MG PO TABS: ORAL_TABLET | ORAL | Status: DC

## 2016-03-25 MED ORDER — METFORMIN HCL 1000 MG PO TABS: 1000.0000 mg | ORAL_TABLET | Freq: Two times a day (BID) | ORAL | Status: DC

## 2016-03-25 MED ORDER — LISINOPRIL 10 MG PO TABS: 10.0000 mg | ORAL_TABLET | Freq: Every day | ORAL | Status: DC

## 2016-03-25 NOTE — Progress Notes (Signed)
   Subjective:    Patient ID: Antonio Fisher, male    DOB: [birth year], 55 y.o.   MRN: [id number]  HPI Pt here for follow up and management of chronic medical problems which includes diabetes. He is taking medications regularly. He came in last week to have A1c done and is 6.1 which is excellent. He takes omeprazole and metformin for diabetes as well as lisinopril for blood pressure. Blood pressure today is 125/81 which has improved from 143/89 4 months ago. He denies any numbness or tingling in his feet or legs. He denies any visual changes.     Patient Active Problem List   Diagnosis Date Noted  . Rhinitis, allergic 07/31/2015  . Type 2 diabetes mellitus (HCC) 11/12/2014   Outpatient Encounter Prescriptions as of 03/25/2016  Medication Sig  . glimepiride (AMARYL) 4 MG tablet TAKE ONE TABLET BY MOUTH ONCE DAILY BEFORE BREAKFAST  . lisinopril (PRINIVIL,ZESTRIL) 10 MG tablet Take 1 tablet (10 mg total) by mouth daily.  . metFORMIN (GLUCOPHAGE) 1000 MG tablet Take 1 tablet (1,000 mg total) by mouth 2 (two) times daily with a meal.  . tadalafil (CIALIS) 10 MG tablet Take 1 tablet (10 mg total) by mouth every other day as needed for erectile dysfunction.  . [DISCONTINUED] fluticasone (FLONASE) 50 MCG/ACT nasal spray Place 2 sprays into both nostrils daily.  . [DISCONTINUED] indomethacin (INDOCIN) 50 MG capsule Take one capsule three times daily   No facility-administered encounter medications on file as of 03/25/2016.      Review of Systems  Constitutional: Negative.   HENT: Negative.   Eyes: Negative.   Respiratory: Negative.   Cardiovascular: Negative.   Gastrointestinal: Negative.   Endocrine: Negative.   Genitourinary: Negative.   Musculoskeletal: Negative.   Skin: Negative.   Allergic/Immunologic: Negative.   Neurological: Negative.   Hematological: Negative.   Psychiatric/Behavioral: Negative.        Objective:   Physical Exam  Constitutional: He is oriented to  person, place, and time. He appears well-developed and well-nourished.  Cardiovascular: Normal rate, regular rhythm, normal heart sounds and intact distal pulses.   Pulmonary/Chest: Effort normal and breath sounds normal.  Neurological: He is alert and oriented to person, place, and time.  Psychiatric: He has a normal mood and affect. His behavior is normal.   BP 125/81 mmHg  Pulse 65  Temp(Src) 97.4 F (36.3 C) (Oral)  Ht 5\' 5"  (1.651 m)  Wt 206 lb (93.441 kg)  BMI 34.28 kg/m2        Assessment & Plan:  1. Type 2 diabetes mellitus without complication, without long-term current use of insulin (HCC) Diabetes is well controlled. Continue same regimen and recheck in 6 months  Frederica KusterStephen M Gardiner Espana MD

## 2016-03-25 NOTE — Patient Instructions (Signed)
Continue current medications. Continue good therapeutic lifestyle changes which include good diet and exercise. Fall precautions discussed with patient. If an FOBT was given today- please return it to our front desk. If you are over 54 years old - you may need Prevnar 13 or the adult Pneumonia vaccine.   After your visit with us today you will receive a survey in the mail or online from [employer] regarding your care with us. Please take a moment to fill this out. Your feedback is very important to us as you can help us better understand your patient needs as well as improve your experience and satisfaction. WE CARE ABOUT YOU!!!    

## 2016-05-06 LAB — HM DIABETES EYE EXAM

## 2016-09-20 ENCOUNTER — Other Ambulatory Visit (INDEPENDENT_AMBULATORY_CARE_PROVIDER_SITE_OTHER): Payer: BLUE CROSS/BLUE SHIELD

## 2016-09-20 DIAGNOSIS — E785 Hyperlipidemia, unspecified: Secondary | ICD-10-CM

## 2016-09-20 DIAGNOSIS — E119 Type 2 diabetes mellitus without complications: Secondary | ICD-10-CM

## 2016-09-20 LAB — BAYER DCA HB A1C WAIVED: HB A1C (BAYER DCA - WAIVED): 6.2 % (ref <7.0)

## 2016-09-21 LAB — CBC WITH DIFFERENTIAL/PLATELET
BASOS ABS: 0.1 10*3/uL (ref 0.0–0.2)
BASOS: 1 %
EOS (ABSOLUTE): 0.4 10*3/uL (ref 0.0–0.4)
Eos: 5 %
Hematocrit: 41.7 % (ref 37.5–51.0)
Hemoglobin: 14.2 g/dL (ref 12.6–17.7)
IMMATURE GRANS (ABS): 0 10*3/uL (ref 0.0–0.1)
IMMATURE GRANULOCYTES: 0 %
LYMPHS: 27 %
Lymphocytes Absolute: 2.2 10*3/uL (ref 0.7–3.1)
MCH: 28.3 pg (ref 26.6–33.0)
MCHC: 34.1 g/dL (ref 31.5–35.7)
MCV: 83 fL (ref 79–97)
MONOS ABS: 0.5 10*3/uL (ref 0.1–0.9)
Monocytes: 6 %
NEUTROS PCT: 61 %
Neutrophils Absolute: 4.9 10*3/uL (ref 1.4–7.0)
PLATELETS: 239 10*3/uL (ref 150–379)
RBC: 5.02 x10E6/uL (ref 4.14–5.80)
RDW: 14 % (ref 12.3–15.4)
WBC: 8.1 10*3/uL (ref 3.4–10.8)

## 2016-09-21 LAB — LIPID PANEL
CHOLESTEROL TOTAL: 202 mg/dL — AB (ref 100–199)
Chol/HDL Ratio: 5.3 ratio units — ABNORMAL HIGH (ref 0.0–5.0)
HDL: 38 mg/dL — ABNORMAL LOW (ref >39)
LDL CALC: 133 mg/dL — AB (ref 0–99)
Triglycerides: 154 mg/dL — ABNORMAL HIGH (ref 0–149)
VLDL CHOLESTEROL CAL: 31 mg/dL (ref 5–40)

## 2016-09-21 LAB — CMP14+EGFR
ALBUMIN: 4.6 g/dL (ref 3.5–5.5)
ALK PHOS: 59 IU/L (ref 39–117)
ALT: 23 IU/L (ref 0–44)
AST: 19 IU/L (ref 0–40)
Albumin/Globulin Ratio: 1.9 (ref 1.2–2.2)
BUN / CREAT RATIO: 19 (ref 9–20)
BUN: 15 mg/dL (ref 6–24)
Bilirubin Total: 0.5 mg/dL (ref 0.0–1.2)
CALCIUM: 9.4 mg/dL (ref 8.7–10.2)
CO2: 25 mmol/L (ref 18–29)
CREATININE: 0.81 mg/dL (ref 0.76–1.27)
Chloride: 102 mmol/L (ref 96–106)
GFR, EST AFRICAN AMERICAN: 116 mL/min/{1.73_m2} (ref >59)
GFR, EST NON AFRICAN AMERICAN: 101 mL/min/{1.73_m2} (ref >59)
GLUCOSE: 172 mg/dL — AB (ref 65–99)
Globulin, Total: 2.4 g/dL (ref 1.5–4.5)
Potassium: 4.7 mmol/L (ref 3.5–5.2)
Sodium: 144 mmol/L (ref 134–144)
TOTAL PROTEIN: 7 g/dL (ref 6.0–8.5)

## 2016-09-23 ENCOUNTER — Encounter: Payer: Self-pay | Admitting: Family Medicine

## 2016-09-23 ENCOUNTER — Ambulatory Visit (INDEPENDENT_AMBULATORY_CARE_PROVIDER_SITE_OTHER): Payer: BLUE CROSS/BLUE SHIELD | Admitting: Family Medicine

## 2016-09-23 VITALS — BP 121/81 | HR 67 | Temp 97.8°F | Ht 65.0 in | Wt 207.0 lb

## 2016-09-23 DIAGNOSIS — I10 Essential (primary) hypertension: Secondary | ICD-10-CM | POA: Diagnosis not present

## 2016-09-23 DIAGNOSIS — E119 Type 2 diabetes mellitus without complications: Secondary | ICD-10-CM | POA: Diagnosis not present

## 2016-09-23 DIAGNOSIS — E1159 Type 2 diabetes mellitus with other circulatory complications: Secondary | ICD-10-CM | POA: Insufficient documentation

## 2016-09-23 MED ORDER — LISINOPRIL 10 MG PO TABS: 10.0000 mg | ORAL_TABLET | Freq: Every day | ORAL | 1 refills | Status: DC

## 2016-09-23 MED ORDER — SILDENAFIL CITRATE 20 MG PO TABS: ORAL_TABLET | ORAL | 0 refills | Status: DC

## 2016-09-23 MED ORDER — GLIMEPIRIDE 4 MG PO TABS: ORAL_TABLET | ORAL | 1 refills | Status: DC

## 2016-09-23 NOTE — Progress Notes (Signed)
   Subjective:    Patient ID: Antonio Fisher, male    DOB: 06-Jul-1961, 55 y.o.   MRN: [id number]  HPI 55 year old male here to follow-up diabetes last A1c was 6.2. Medications include glimepiride and metformin. Blood pressure is well controlled on lisinopril. There are no side effects. On his lab work from earlier this month he did have lipids assessed and LDL is in the 120-30 range. He would probably benefit from a statin but he would rather not take another pill so we will follow  Patient Active Problem List   Diagnosis Date Noted  . Rhinitis, allergic 07/31/2015  . Type 2 diabetes mellitus (HCC) 11/12/2014   Outpatient Encounter Prescriptions as of 09/23/2016  Medication Sig  . glimepiride (AMARYL) 4 MG tablet TAKE ONE TABLET BY MOUTH ONCE DAILY BEFORE BREAKFAST  . lisinopril (PRINIVIL,ZESTRIL) 10 MG tablet Take 1 tablet (10 mg total) by mouth daily.  . metFORMIN (GLUCOPHAGE) 1000 MG tablet Take 1 tablet (1,000 mg total) by mouth 2 (two) times daily with a meal.  . tadalafil (CIALIS) 10 MG tablet Take 1 tablet (10 mg total) by mouth every other day as needed for erectile dysfunction.  . [DISCONTINUED] glimepiride (AMARYL) 4 MG tablet TAKE ONE TABLET BY MOUTH ONCE DAILY BEFORE BREAKFAST  . [DISCONTINUED] lisinopril (PRINIVIL,ZESTRIL) 10 MG tablet Take 1 tablet (10 mg total) by mouth daily.  . sildenafil (REVATIO) 20 MG tablet Take 2 - 5 tablets  . [DISCONTINUED] sildenafil (REVATIO) 20 MG tablet Take 2 - 5 tablets   No facility-administered encounter medications on file as of 09/23/2016.       Review of Systems  Constitutional: Negative.   HENT: Negative.   Eyes: Negative.   Respiratory: Negative.  Negative for shortness of breath.   Cardiovascular: Negative.  Negative for chest pain and leg swelling.  Gastrointestinal: Negative.   Genitourinary: Negative.   Musculoskeletal: Negative.   Skin: Negative.   Neurological: Negative.   Psychiatric/Behavioral: Negative.   All other  systems reviewed and are negative.      Objective:   Physical Exam  Constitutional: He is oriented to person, place, and time. He appears well-developed and well-nourished.  Cardiovascular: Normal rate, regular rhythm, normal heart sounds and intact distal pulses.   Pulmonary/Chest: Effort normal and breath sounds normal.  Neurological: He is alert and oriented to person, place, and time.  Psychiatric: He has a normal mood and affect. His behavior is normal.   BP 121/81   Pulse 67   Temp 97.8 F (36.6 C) (Oral)   Ht 5\' 5"  (1.651 m)   Wt 207 lb (93.9 kg)   BMI 34.45 kg/m         Assessment & Plan:  1. Type 2 diabetes mellitus without complication, without long-term current use of insulin (HCC) Continue  with current treatment as A1c is well controlled at 6.2 - Microalbumin / creatinine urine ratio   2. Essential hypertension 10 year lisinopril as before.  Frederica KusterStephen M Miller MD

## 2016-09-24 LAB — MICROALBUMIN / CREATININE URINE RATIO
CREATININE, UR: 123.9 mg/dL
MICROALBUM., U, RANDOM: 56.6 ug/mL
Microalb/Creat Ratio: 45.7 mg/g creat — ABNORMAL HIGH (ref 0.0–30.0)

## 2016-12-28 ENCOUNTER — Encounter: Payer: Self-pay | Admitting: *Deleted

## 2017-02-23 ENCOUNTER — Ambulatory Visit (INDEPENDENT_AMBULATORY_CARE_PROVIDER_SITE_OTHER): Payer: 59 | Admitting: Nurse Practitioner

## 2017-02-23 ENCOUNTER — Encounter: Payer: Self-pay | Admitting: Nurse Practitioner

## 2017-02-23 VITALS — BP 132/79 | HR 76 | Temp 97.6°F | Ht 65.0 in | Wt 209.0 lb

## 2017-02-23 DIAGNOSIS — M545 Low back pain: Secondary | ICD-10-CM | POA: Diagnosis not present

## 2017-02-23 LAB — URINALYSIS, COMPLETE
Bilirubin, UA: NEGATIVE
Glucose, UA: NEGATIVE
Leukocytes, UA: NEGATIVE
Nitrite, UA: NEGATIVE
SPEC GRAV UA: 1.02 (ref 1.005–1.030)
Urobilinogen, Ur: 1 mg/dL (ref 0.2–1.0)
pH, UA: 6 (ref 5.0–7.5)

## 2017-02-23 LAB — MICROSCOPIC EXAMINATION
Bacteria, UA: NONE SEEN
EPITHELIAL CELLS (NON RENAL): NONE SEEN /HPF (ref 0–10)
RENAL EPITHEL UA: NONE SEEN /HPF
WBC, UA: NONE SEEN /hpf (ref 0–?)

## 2017-02-23 MED ORDER — CYCLOBENZAPRINE HCL 10 MG PO TABS: 10.0000 mg | ORAL_TABLET | Freq: Three times a day (TID) | ORAL | 1 refills | Status: DC | PRN

## 2017-02-23 MED ORDER — NAPROXEN 500 MG PO TABS: 500.0000 mg | ORAL_TABLET | Freq: Two times a day (BID) | ORAL | 1 refills | Status: DC

## 2017-02-23 NOTE — Patient Instructions (Signed)
Back Pain, Adult  Back pain is very common. The pain often gets better over time. The cause of back pain is usually not dangerous. Most people can learn to manage their back pain on their own.  Follow these instructions at home:  Watch your back pain for any changes. The following actions may help to lessen any pain you are feeling:  · Stay active. Start with short walks on flat ground if you can. Try to walk farther each day.  · Exercise regularly as told by your doctor. Exercise helps your back heal faster. It also helps avoid future injury by keeping your muscles strong and flexible.  · Do not sit, drive, or stand in one place for more than 30 minutes.  · Do not stay in bed. Resting more than 1-2 days can slow down your recovery.  · Be careful when you bend or lift an object. Use good form when lifting:  ? Bend at your knees.  ? Keep the object close to your body.  ? Do not twist.  · Sleep on a firm mattress. Lie on your side, and bend your knees. If you lie on your back, put a pillow under your knees.  · Take medicines only as told by your doctor.  · Put ice on the injured area.  ? Put ice in a plastic bag.  ? Place a towel between your skin and the bag.  ? Leave the ice on for 20 minutes, 2-3 times a day for the first 2-3 days. After that, you can switch between ice and heat packs.  · Avoid feeling anxious or stressed. Find good ways to deal with stress, such as exercise.  · Maintain a healthy weight. Extra weight puts stress on your back.    Contact a doctor if:  · You have pain that does not go away with rest or medicine.  · You have worsening pain that goes down into your legs or buttocks.  · You have pain that does not get better in one week.  · You have pain at night.  · You lose weight.  · You have a fever or chills.  Get help right away if:  · You cannot control when you poop (bowel movement) or pee (urinate).  · Your arms or legs feel weak.  · Your arms or legs lose feeling (numbness).  · You feel sick  to your stomach (nauseous) or throw up (vomit).  · You have belly (abdominal) pain.  · You feel like you may pass out (faint).  This information is not intended to replace advice given to you by your health care provider. Make sure you discuss any questions you have with your health care provider.  Document Released: 04/18/2008 Document Revised: 04/07/2016 Document Reviewed: 03/04/2014  Elsevier Interactive Patient Education © 2017 [employer]

## 2017-02-23 NOTE — Progress Notes (Signed)
   Subjective:    Patient ID: Antonio Fisher, male    DOB: 01-Jul-1961, 56 y.o.   MRN: [id number]  HPI Patient comes in c/o right sided flank pain  started 2 days ago.    Review of Systems  Constitutional: Negative.   HENT: Negative.   Respiratory: Negative.   Cardiovascular: Negative.   Genitourinary: Positive for dysuria and frequency. Negative for flank pain, hematuria, penile pain, scrotal swelling, testicular pain and urgency.  Neurological: Negative.   Psychiatric/Behavioral: Negative.   All other systems reviewed and are negative.      Objective:   Physical Exam  Constitutional: He is oriented to person, place, and time. He appears well-developed and well-nourished. No distress.  Cardiovascular: Normal rate and regular rhythm.   Pulmonary/Chest: Effort normal and breath sounds normal.  Musculoskeletal:  Right lower back pain on palpation  Neurological: He is alert and oriented to person, place, and time.  Skin: Skin is warm. No rash noted.   BP 132/79   Pulse 76   Temp 97.6 F (36.4 C) (Oral)   Ht  (1.651 m)   Wt 209 lb (94.8 kg)   BMI 34.78 kg/m   Urine negative      Assessment & Plan:  1. Acute left-sided low back pain, with sciatica presence unspecified Moist heat to area No strenous activity RTO prn - Urinalysis, Complete - cyclobenzaprine (FLEXERIL) 10 MG tablet; Take 1 tablet (10 mg total) by mouth 3 (three) times daily as needed for muscle spasms.  Dispense: 30 tablet; Refill: 1 - naproxen (NAPROSYN) 500 MG tablet; Take 1 tablet (500 mg total) by mouth 2 (two) times daily with a meal.  Dispense: 60 tablet; Refill: 1   Mary-Margaret Daphine Deutscher, FNP

## 2017-03-17 ENCOUNTER — Other Ambulatory Visit: Payer: 59

## 2017-03-17 DIAGNOSIS — I10 Essential (primary) hypertension: Secondary | ICD-10-CM

## 2017-03-17 DIAGNOSIS — E119 Type 2 diabetes mellitus without complications: Secondary | ICD-10-CM

## 2017-03-18 LAB — LIPID PANEL
CHOL/HDL RATIO: 5.6 ratio — AB (ref 0.0–5.0)
Cholesterol, Total: 206 mg/dL — ABNORMAL HIGH (ref 100–199)
HDL: 37 mg/dL — ABNORMAL LOW (ref >39)
LDL Calculated: 133 mg/dL — ABNORMAL HIGH (ref 0–99)
TRIGLYCERIDES: 182 mg/dL — AB (ref 0–149)
VLDL Cholesterol Cal: 36 mg/dL (ref 5–40)

## 2017-03-18 LAB — CBC WITH DIFFERENTIAL/PLATELET
Basophils Absolute: 0.1 10*3/uL (ref 0.0–0.2)
Basos: 1 %
EOS (ABSOLUTE): 0.6 10*3/uL — AB (ref 0.0–0.4)
Eos: 7 %
Hematocrit: 43.2 % (ref 37.5–51.0)
Hemoglobin: 14.7 g/dL (ref 13.0–17.7)
Immature Grans (Abs): 0 10*3/uL (ref 0.0–0.1)
Immature Granulocytes: 0 %
LYMPHS ABS: 2.3 10*3/uL (ref 0.7–3.1)
Lymphs: 26 %
MCH: 28.4 pg (ref 26.6–33.0)
MCHC: 34 g/dL (ref 31.5–35.7)
MCV: 83 fL (ref 79–97)
MONOS ABS: 0.6 10*3/uL (ref 0.1–0.9)
Monocytes: 6 %
Neutrophils Absolute: 5.1 10*3/uL (ref 1.4–7.0)
Neutrophils: 60 %
Platelets: 215 10*3/uL (ref 150–379)
RBC: 5.18 x10E6/uL (ref 4.14–5.80)
RDW: 15.1 % (ref 12.3–15.4)
WBC: 8.7 10*3/uL (ref 3.4–10.8)

## 2017-03-18 LAB — CMP14+EGFR
A/G RATIO: 1.8 (ref 1.2–2.2)
ALBUMIN: 4.4 g/dL (ref 3.5–5.5)
ALK PHOS: 58 IU/L (ref 39–117)
ALT: 28 IU/L (ref 0–44)
AST: 26 IU/L (ref 0–40)
BILIRUBIN TOTAL: 0.6 mg/dL (ref 0.0–1.2)
BUN / CREAT RATIO: 17 (ref 9–20)
BUN: 16 mg/dL (ref 6–24)
CO2: 23 mmol/L (ref 18–29)
CREATININE: 0.95 mg/dL (ref 0.76–1.27)
Calcium: 9.4 mg/dL (ref 8.7–10.2)
Chloride: 101 mmol/L (ref 96–106)
GFR calc Af Amer: 104 mL/min/{1.73_m2} (ref >59)
GFR calc non Af Amer: 90 mL/min/{1.73_m2} (ref >59)
GLOBULIN, TOTAL: 2.4 g/dL (ref 1.5–4.5)
GLUCOSE: 185 mg/dL — AB (ref 65–99)
POTASSIUM: 4.4 mmol/L (ref 3.5–5.2)
SODIUM: 143 mmol/L (ref 134–144)
Total Protein: 6.8 g/dL (ref 6.0–8.5)

## 2017-03-20 ENCOUNTER — Encounter: Payer: Self-pay | Admitting: Family Medicine

## 2017-03-20 ENCOUNTER — Ambulatory Visit (INDEPENDENT_AMBULATORY_CARE_PROVIDER_SITE_OTHER): Payer: BLUE CROSS/BLUE SHIELD | Admitting: Family Medicine

## 2017-03-20 VITALS — BP 130/83 | HR 88 | Temp 99.9°F | Ht 65.0 in | Wt 208.0 lb

## 2017-03-20 DIAGNOSIS — I10 Essential (primary) hypertension: Secondary | ICD-10-CM | POA: Diagnosis not present

## 2017-03-20 DIAGNOSIS — E1169 Type 2 diabetes mellitus with other specified complication: Secondary | ICD-10-CM

## 2017-03-20 DIAGNOSIS — E785 Hyperlipidemia, unspecified: Secondary | ICD-10-CM | POA: Diagnosis not present

## 2017-03-20 DIAGNOSIS — E119 Type 2 diabetes mellitus without complications: Secondary | ICD-10-CM | POA: Diagnosis not present

## 2017-03-20 MED ORDER — METFORMIN HCL 1000 MG PO TABS: 1000.0000 mg | ORAL_TABLET | Freq: Two times a day (BID) | ORAL | 3 refills | Status: DC

## 2017-03-20 MED ORDER — SILDENAFIL CITRATE 20 MG PO TABS: ORAL_TABLET | ORAL | 0 refills | Status: DC

## 2017-03-20 MED ORDER — GLIMEPIRIDE 4 MG PO TABS: ORAL_TABLET | ORAL | 1 refills | Status: DC

## 2017-03-20 MED ORDER — LISINOPRIL 10 MG PO TABS: 10.0000 mg | ORAL_TABLET | Freq: Every day | ORAL | 1 refills | Status: DC

## 2017-03-20 MED ORDER — TADALAFIL 10 MG PO TABS: 10.0000 mg | ORAL_TABLET | ORAL | 0 refills | Status: DC | PRN

## 2017-03-20 NOTE — Progress Notes (Signed)
BP 130/83   Pulse 88   Temp 99.9 F (37.7 C) (Oral)   Ht 5\' 5"  (1.651 m)   Wt 208 lb (94.3 kg)   BMI 34.61 kg/m    Subjective:    Patient ID: Antonio Fisher, male    DOB: 10/27/1961, 56 y.o.   MRN: [id number]  HPI: Antonio Fisher is a 56 y.o. male presenting on 03/20/2017 for Diabetes (followup; had labwork on Friday)   HPI Type 2 diabetes mellitus Patient comes in today for recheck of his diabetes. Patient has been currently taking Metformin and glimepiride. Patient is currently on an ACE inhibitor. Patient has not seen an ophthalmologist this year. Patient denies any new issues with his feet. His last A1c showed that his diabetes was controlled and we will recheck it today. He had his labs done a few days prior to coming in and his blood glucose was 185  Hyperlipidemia Patient is coming in for recheck of his hyperlipidemia. He is currently taking fish oils. He denies any issues with myalgias or history of liver damage from it. He denies any focal numbness or weakness or chest pain. Patient had his labs done a few days ago and his triglycerides were 182 and his LDL was 133. Because of the diabetes we want his LDL to be less than 100 preferably.  Hypertension recheck Patient is coming in for hypertension recheck his blood pressure is 130/83. He is currently on lisinopril. Patient denies headaches, blurred vision, chest pains, shortness of breath, or weakness. Denies any side effects from medication and is content with current medication.   Relevant past medical, surgical, family and social history reviewed and updated as indicated. Interim medical history since our last visit reviewed. Allergies and medications reviewed and updated.  Review of Systems  Constitutional: Negative for chills and fever.  Respiratory: Negative for shortness of breath and wheezing.   Cardiovascular: Negative for chest pain and leg swelling.  Musculoskeletal: Negative for back pain and gait problem.    Skin: Negative for rash.  Neurological: Negative for dizziness, weakness and light-headedness.  All other systems reviewed and are negative.   Per HPI unless specifically indicated above      Objective:    BP 130/83   Pulse 88   Temp 99.9 F (37.7 C) (Oral)   Ht 5\' 5"  (1.651 m)   Wt 208 lb (94.3 kg)   BMI 34.61 kg/m   Wt Readings from Last 3 Encounters:  03/20/17 208 lb (94.3 kg)  02/23/17 209 lb (94.8 kg)  09/23/16 207 lb (93.9 kg)    Physical Exam  Constitutional: He is oriented to person, place, and time. He appears well-developed and well-nourished. No distress.  Eyes: Conjunctivae are normal. No scleral icterus.  Neck: Neck supple. No thyromegaly present.  Cardiovascular: Normal rate, regular rhythm, normal heart sounds and intact distal pulses.   No murmur heard. Pulmonary/Chest: Effort normal and breath sounds normal. No respiratory distress. He has no wheezes. He has no rales.  Musculoskeletal: Normal range of motion. He exhibits no edema.  Lymphadenopathy:    He has no cervical adenopathy.  Neurological: He is alert and oriented to person, place, and time. Coordination normal.  Skin: Skin is warm and dry. No rash noted. He is not diaphoretic.  Psychiatric: He has a normal mood and affect. His behavior is normal.  Nursing note and vitals reviewed.        Assessment & Plan:   Problem List Items Addressed This Visit  Cardiovascular and Mediastinum   Essential hypertension   Relevant Medications   tadalafil (CIALIS) 10 MG tablet   lisinopril (PRINIVIL,ZESTRIL) 10 MG tablet     Endocrine   Type 2 diabetes mellitus (HCC) - Primary   Relevant Medications   metFORMIN (GLUCOPHAGE) 1000 MG tablet   lisinopril (PRINIVIL,ZESTRIL) 10 MG tablet   glimepiride (AMARYL) 4 MG tablet   Other Relevant Orders   Bayer DCA Hb A1c Waived   Hyperlipidemia associated with type 2 diabetes mellitus (HCC)   Relevant Medications   tadalafil (CIALIS) 10 MG tablet    metFORMIN (GLUCOPHAGE) 1000 MG tablet   lisinopril (PRINIVIL,ZESTRIL) 10 MG tablet   glimepiride (AMARYL) 4 MG tablet       Follow up plan: Return in about 3 months (around 06/20/2017), or if symptoms worsen or fail to improve, for Diabetes recheck.  Counseling provided for all of the vaccine components Orders Placed This Encounter  Procedures  . Bayer [facility] Hb A1c Waived    Arville Care, MD [facility] 03/20/2017, 4:48 PM

## 2017-03-22 ENCOUNTER — Other Ambulatory Visit: Payer: Self-pay | Admitting: *Deleted

## 2017-03-22 ENCOUNTER — Telehealth: Payer: Self-pay | Admitting: Family Medicine

## 2017-03-22 LAB — HGB A1C W/O EAG: Hgb A1c MFr Bld: 6.7 % — ABNORMAL HIGH (ref 4.8–5.6)

## 2017-03-22 LAB — SPECIMEN STATUS REPORT

## 2017-03-22 MED ORDER — ATORVASTATIN CALCIUM 40 MG PO TABS: 40.0000 mg | ORAL_TABLET | Freq: Every day | ORAL | 1 refills | Status: DC

## 2017-03-22 NOTE — Telephone Encounter (Signed)
[facility] Pt notified

## 2017-03-22 NOTE — Progress Notes (Unsigned)
lipi

## 2017-03-23 ENCOUNTER — Ambulatory Visit: Payer: BLUE CROSS/BLUE SHIELD | Admitting: Family Medicine

## 2017-03-30 ENCOUNTER — Ambulatory Visit (INDEPENDENT_AMBULATORY_CARE_PROVIDER_SITE_OTHER): Payer: 59 | Admitting: Nurse Practitioner

## 2017-03-30 ENCOUNTER — Encounter: Payer: Self-pay | Admitting: Nurse Practitioner

## 2017-03-30 VITALS — BP 139/88 | HR 92 | Temp 100.4°F | Ht 65.0 in | Wt 207.4 lb

## 2017-03-30 DIAGNOSIS — L209 Atopic dermatitis, unspecified: Secondary | ICD-10-CM

## 2017-03-30 MED ORDER — TRIAMCINOLONE ACETONIDE 0.1 % EX CREA: 1.0000 "application " | TOPICAL_CREAM | Freq: Two times a day (BID) | CUTANEOUS | 0 refills | Status: DC

## 2017-03-30 NOTE — Progress Notes (Addendum)
   Subjective:    Patient ID: Antonio Fisher, male    DOB: [birth year], 56 y.o.   MRN: [id number]  HPI Patient comes in with his wife today c/o of rash that developed on his left lower leg. Denies pain or itching.  * he is still c/o right lower back pain that only hurts when he needs to void- have no xray this evening so he will come back Monday to reevaluate that.  Review of Systems  Constitutional: Negative.   HENT: Negative.   Respiratory: Negative.   Cardiovascular: Negative.   Genitourinary: Negative.   Skin: Positive for rash.  Neurological: Negative.   Psychiatric/Behavioral: Negative.   All other systems reviewed and are negative.      Objective:   Physical Exam  Constitutional: He appears well-developed and well-nourished. No distress.  Cardiovascular: Normal rate and regular rhythm.   Pulmonary/Chest: Effort normal and breath sounds normal.  Skin: Skin is warm. Rash (erythematous macular rash on left lower shin) noted.   BP 139/88   Pulse 92   Temp (!) 100.4 F (38 C) (Oral)   Ht 5\' 5"  (1.651 m)   Wt 207 lb 6.4 oz (94.1 kg)   BMI 34.51 kg/m      Assessment & Plan:   1. Atopic dermatitis, unspecified type    Meds ordered this encounter  Medications  . triamcinolone cream (KENALOG) 0.1 %    Sig: Apply 1 application topically 2 (two) times daily.    Dispense:  30 g    Refill:  0    Order Specific Question:   Supervising Provider    Answer:   Oswaldo DoneVINCENT, CAROL L [[id number]]  * rash does not look like shingles* Avoid scratching  RTO if not improving Follow up next week if still having back pain so we can do xrays  Antonio [place] Beach[place], FNP   * when discharging patient he started c/o severe left groin pain when he has to void- he says pain is so intense that he cannot hold his urine- started 2 days ago. Has soreness in testicle and down left leg..  No testicular nodules or erythema Urine dip positive for moderate blood * told to RTO tomorrow to have KUB- call  and make sure machine repaired. Force fluids tonight  Antonio Daphine DeutscherMartin, FNP

## 2017-03-31 ENCOUNTER — Ambulatory Visit (INDEPENDENT_AMBULATORY_CARE_PROVIDER_SITE_OTHER): Payer: 59 | Admitting: Family Medicine

## 2017-03-31 ENCOUNTER — Encounter: Payer: Self-pay | Admitting: Family Medicine

## 2017-03-31 ENCOUNTER — Ambulatory Visit (INDEPENDENT_AMBULATORY_CARE_PROVIDER_SITE_OTHER): Payer: 59

## 2017-03-31 VITALS — BP 128/83 | HR 81 | Temp 97.2°F | Ht 65.0 in | Wt 207.0 lb

## 2017-03-31 DIAGNOSIS — R109 Unspecified abdominal pain: Secondary | ICD-10-CM

## 2017-03-31 DIAGNOSIS — D690 Allergic purpura: Secondary | ICD-10-CM | POA: Diagnosis not present

## 2017-03-31 MED ORDER — PREDNISONE 20 MG PO TABS: 20.0000 mg | ORAL_TABLET | Freq: Every day | ORAL | 0 refills | Status: DC

## 2017-03-31 NOTE — Progress Notes (Signed)
  Chief Complaint  Patient presents with  . Leg Pain    pt here today c/o lower leg redness, tenerness and hot to the touch. This morning another spot showed up further up his leg and he has pain that starts at the lower leg and shoots up to his groin.    HPI  Patient presents today for Several days of increasing rash on the left lower extremity. It is painless. It does not itch. It is sore to touch. The soreness is significant but only with touch. There has been no known injury. He has had some recent back pain with possibility of kidney stone. He underwent a KUB today at the recommendation of Ms. Daphine DeutscherMartin in the office who saw him yesterday. He has right mid to lower back pain which seems to happen more when he tries to urinate. The right lower extremity is free of rash.  PMH: Smoking status noted ROS: Review of Systems  Constitutional: Negative for chills, diaphoresis and fever.  HENT: Negative.  Negative for congestion and sore throat.   Eyes: Negative.   Respiratory: Negative for shortness of breath.   Cardiovascular: Negative for chest pain and palpitations.  Gastrointestinal: Negative for abdominal pain.  Genitourinary: Negative for dysuria and frequency.  Musculoskeletal: Positive for back pain and myalgias.  Skin: Positive for rash. Negative for itching.  Neurological: Negative for dizziness and weakness.    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 It has been present for several days. It is slightly  raised. Erythematous to violaceous with almost black punctate lesions. The smaller lesion near the top of the photograph is circled and is erythematous with 3-4 vesicles scattered about it. The larger lesion measures 10 cm at the longer vertical pole. The smaller lesion is 1.5 cm in diameter Assessment and plan:  Flank pain - Plan: DG Abd 1 View  Vasculitis, allergic purpura (HCC) - Plan: Mpo/pr-3 (anca) antibodies, Sedimentation rate, Arthritis Panel    Orders Placed This Encounter  Procedures  . DG Abd 1 View    Standing Status:   Future    Number of Occurrences:   1    Standing Expiration Date:   05/31/2018    Order Specific Question:   Reason for Exam (SYMPTOM  OR DIAGNOSIS REQUIRED)    Answer:   severe flank pain and hematuria    Order Specific Question:   Preferred imaging location?    Answer:   Internal  . Mpo/pr-3 (anca) antibodies  . Sedimentation rate  . Arthritis Panel    Meds ordered this encounter  Medications  . predniSONE (DELTASONE) 20 MG tablet    Sig: Take 1 tablet (20 mg total) by mouth daily with breakfast.    Dispense:  30 tablet    Refill:  0

## 2017-04-01 LAB — ARTHRITIS PANEL
BASOS: 0 %
Basophils Absolute: 0 10*3/uL (ref 0.0–0.2)
EOS (ABSOLUTE): 0.2 10*3/uL (ref 0.0–0.4)
Eos: 2 %
HEMATOCRIT: 40.4 % (ref 37.5–51.0)
HEMOGLOBIN: 14.2 g/dL (ref 13.0–17.7)
Immature Grans (Abs): 0 10*3/uL (ref 0.0–0.1)
Immature Granulocytes: 0 %
LYMPHS ABS: 2.2 10*3/uL (ref 0.7–3.1)
Lymphs: 23 %
MCH: 28.6 pg (ref 26.6–33.0)
MCHC: 35.1 g/dL (ref 31.5–35.7)
MCV: 81 fL (ref 79–97)
MONOS ABS: 1.2 10*3/uL — AB (ref 0.1–0.9)
Monocytes: 12 %
NEUTROS ABS: 5.9 10*3/uL (ref 1.4–7.0)
Neutrophils: 63 %
PLATELETS: 211 10*3/uL (ref 150–379)
RBC: 4.97 x10E6/uL (ref 4.14–5.80)
RDW: 14.8 % (ref 12.3–15.4)
RHEUMATOID FACTOR: 11.8 [IU]/mL (ref 0.0–13.9)
Sed Rate: 4 mm/hr (ref 0–30)
Uric Acid: 7.3 mg/dL (ref 3.7–8.6)
WBC: 9.4 10*3/uL (ref 3.4–10.8)

## 2017-04-01 LAB — MPO/PR-3 (ANCA) ANTIBODIES: ANCA Proteinase 3: <3.5 U/mL (ref 0.0–3.5)

## 2017-05-01 ENCOUNTER — Encounter: Payer: Self-pay | Admitting: Family Medicine

## 2017-05-01 ENCOUNTER — Ambulatory Visit (INDEPENDENT_AMBULATORY_CARE_PROVIDER_SITE_OTHER): Payer: 59 | Admitting: Family Medicine

## 2017-05-01 VITALS — BP 124/76 | HR 89 | Temp 97.8°F | Ht 65.0 in | Wt 201.0 lb

## 2017-05-01 DIAGNOSIS — D690 Allergic purpura: Secondary | ICD-10-CM | POA: Diagnosis not present

## 2017-05-01 NOTE — Progress Notes (Signed)
BP 124/76   Pulse 89   Temp 97.8 F (36.6 C) (Oral)   Ht 5\' 5"  (1.651 m)   Wt 201 lb (91.2 kg)   BMI 33.45 kg/m    Subjective:    Patient ID: Antonio Fisher, male    DOB: 1961-06-28, 56 y.o.   MRN: [id number]  HPI: Antonio DienerBobby D Payette is a 56 y.o. male presenting on 05/01/2017 for Rash (followup, left lower extremity; was given Prednisone which helped clear up, has 4 pills left)   HPI Rash follow-up Patient is coming in today for follow-up on his rash which was diagnosed as a vasculitis and he was started on prednisone, he says the rash is resolved and he is feeling a lot better and is flank pain is completely resolved as well. The challenges we don't know if this was due to medication or infection. He had started the Lipitor recently before this happened and it is a possibility that it could be from the Lipitor, we're going to wait at least 2 weeks before trying it again and if we have any reactions then we will not go back on statins. He feels great today and has no rash and feels back to normal and is taking his omega-3 Fish oils currently.  Relevant past medical, surgical, family and social history reviewed and updated as indicated. Interim medical history since our last visit reviewed. Allergies and medications reviewed and updated.  Review of Systems  Constitutional: Negative for chills and fever.  Eyes: Negative for discharge.  Respiratory: Negative for shortness of breath and wheezing.   Cardiovascular: Negative for chest pain and leg swelling.  Gastrointestinal: Negative for abdominal pain.  Genitourinary: Negative for dysuria and flank pain.  Musculoskeletal: Negative for back pain and gait problem.  Skin: Negative for color change and rash.  All other systems reviewed and are negative.   Per HPI unless specifically indicated above        Objective:    BP 124/76   Pulse 89   Temp 97.8 F (36.6 C) (Oral)   Ht 5\' 5"  (1.651 m)   Wt 201 lb (91.2 kg)   BMI 33.45  kg/m   Wt Readings from Last 3 Encounters:  05/01/17 201 lb (91.2 kg)  03/31/17 207 lb (93.9 kg)  03/30/17 207 lb 6.4 oz (94.1 kg)    Physical Exam  Constitutional: He is oriented to person, place, and time. He appears well-developed and well-nourished. No distress.  Eyes: Conjunctivae are normal. No scleral icterus.  Cardiovascular: Normal rate, regular rhythm, normal heart sounds and intact distal pulses.   No murmur heard. Pulmonary/Chest: Effort normal and breath sounds normal. No respiratory distress. He has no wheezes. He has no rales.  Musculoskeletal: Normal range of motion. He exhibits no edema.  Neurological: He is alert and oriented to person, place, and time. Coordination normal.  Skin: Skin is warm and dry. No rash noted. He is not diaphoretic. No erythema.  Psychiatric: He has a normal mood and affect. His behavior is normal.  Nursing note and vitals reviewed.     Assessment & Plan:   Problem List Items Addressed This Visit    None    Visit Diagnoses    Vasculitis, allergic purpura (HCC)    -  Primary       Follow up plan: Return if symptoms worsen or fail to improve.  Counseling provided for all of the vaccine components No orders of the defined types were placed in this encounter.  Arville Care, MD [facility] 05/01/2017, 4:02 PM

## 2017-06-16 ENCOUNTER — Other Ambulatory Visit: Payer: 59

## 2017-06-16 DIAGNOSIS — E1169 Type 2 diabetes mellitus with other specified complication: Secondary | ICD-10-CM

## 2017-06-16 DIAGNOSIS — E785 Hyperlipidemia, unspecified: Principal | ICD-10-CM

## 2017-06-16 DIAGNOSIS — I10 Essential (primary) hypertension: Secondary | ICD-10-CM

## 2017-06-16 DIAGNOSIS — E119 Type 2 diabetes mellitus without complications: Secondary | ICD-10-CM

## 2017-06-16 LAB — BAYER DCA HB A1C WAIVED: HB A1C (BAYER DCA - WAIVED): 7 % — ABNORMAL HIGH (ref <7.0)

## 2017-06-17 LAB — CMP14+EGFR
A/G RATIO: 1.9 (ref 1.2–2.2)
ALBUMIN: 4.4 g/dL (ref 3.5–5.5)
ALK PHOS: 59 IU/L (ref 39–117)
ALT: 19 IU/L (ref 0–44)
AST: 19 IU/L (ref 0–40)
BILIRUBIN TOTAL: 0.4 mg/dL (ref 0.0–1.2)
BUN/Creatinine Ratio: 13 (ref 9–20)
BUN: 11 mg/dL (ref 6–24)
CO2: 24 mmol/L (ref 20–29)
Calcium: 9 mg/dL (ref 8.7–10.2)
Chloride: 102 mmol/L (ref 96–106)
Creatinine, Ser: 0.86 mg/dL (ref 0.76–1.27)
GFR, EST AFRICAN AMERICAN: 113 mL/min/{1.73_m2} (ref >59)
GFR, EST NON AFRICAN AMERICAN: 98 mL/min/{1.73_m2} (ref >59)
GLUCOSE: 197 mg/dL — AB (ref 65–99)
Globulin, Total: 2.3 g/dL (ref 1.5–4.5)
Potassium: 4.4 mmol/L (ref 3.5–5.2)
Sodium: 143 mmol/L (ref 134–144)
TOTAL PROTEIN: 6.7 g/dL (ref 6.0–8.5)

## 2017-06-17 LAB — LIPID PANEL
CHOL/HDL RATIO: 2.9 ratio (ref 0.0–5.0)
Cholesterol, Total: 116 mg/dL (ref 100–199)
HDL: 40 mg/dL (ref >39)
LDL Calculated: 54 mg/dL (ref 0–99)
TRIGLYCERIDES: 110 mg/dL (ref 0–149)
VLDL CHOLESTEROL CAL: 22 mg/dL (ref 5–40)

## 2017-06-19 ENCOUNTER — Other Ambulatory Visit: Payer: Self-pay | Admitting: *Deleted

## 2017-06-19 MED ORDER — EMPAGLIFLOZIN 10 MG PO TABS: 10.0000 mg | ORAL_TABLET | Freq: Every day | ORAL | 2 refills | Status: DC

## 2017-06-21 ENCOUNTER — Ambulatory Visit (INDEPENDENT_AMBULATORY_CARE_PROVIDER_SITE_OTHER): Payer: 59 | Admitting: Family Medicine

## 2017-06-21 VITALS — BP 138/89 | Temp 97.5°F | Ht 65.0 in | Wt 204.0 lb

## 2017-06-21 DIAGNOSIS — E1169 Type 2 diabetes mellitus with other specified complication: Secondary | ICD-10-CM

## 2017-06-21 DIAGNOSIS — I10 Essential (primary) hypertension: Secondary | ICD-10-CM

## 2017-06-21 DIAGNOSIS — E785 Hyperlipidemia, unspecified: Secondary | ICD-10-CM

## 2017-06-21 DIAGNOSIS — E119 Type 2 diabetes mellitus without complications: Secondary | ICD-10-CM

## 2017-06-22 ENCOUNTER — Encounter: Payer: Self-pay | Admitting: Family Medicine

## 2017-06-25 NOTE — Progress Notes (Signed)
BP 138/89   Temp (!) 97.5 F (36.4 C) (Oral)   Ht 5\' 5"  (1.651 m)   Wt 204 lb (92.5 kg)   BMI 33.95 kg/m    Subjective:    Patient ID: Antonio Fisher, male    DOB: 1960/11/15, 56 y.o.   MRN: [id number]  HPI: COURVOISIER HAMBLEN is a 56 y.o. male presenting on 06/21/2017 for Diabetes   HPI Type 2 diabetes mellitus Patient comes in today for recheck of his diabetes. Patient has been currently taking metformin and glyburide and Jardiance. Patient is currently on an ACE inhibitor/ARB. Patient has not seen an ophthalmologist this year but plans to October. Patient denies any issues with their feet.   Hyperlipidemia Patient is coming in for recheck of his hyperlipidemia. The patient is currently taking Lipitor. They deny any issues with myalgias or history of liver damage from it. They deny any focal numbness or weakness or chest pain.   Hypertension Patient is currently on lisinopril, and their blood pressure today is 138/89. Patient denies any lightheadedness or dizziness. Patient denies headaches, blurred vision, chest pains, shortness of breath, or weakness. Denies any side effects from medication and is content with current medication.   Relevant past medical, surgical, family and social history reviewed and updated as indicated. Interim medical history since our last visit reviewed. Allergies and medications reviewed and updated.  Review of Systems  Constitutional: Negative for chills and fever.  Eyes: Negative for discharge.  Respiratory: Negative for shortness of breath and wheezing.   Cardiovascular: Negative for chest pain and leg swelling.  Musculoskeletal: Negative for back pain and gait problem.  Skin: Negative for rash.  Neurological: Negative for dizziness, weakness, light-headedness and numbness.  All other systems reviewed and are negative.   Per HPI unless specifically indicated above     Objective:    BP 138/89   Temp (!) 97.5 F (36.4 C) (Oral)   Ht 5\' 5"   (1.651 m)   Wt 204 lb (92.5 kg)   BMI 33.95 kg/m   Wt Readings from Last 3 Encounters:  06/21/17 204 lb (92.5 kg)  05/01/17 201 lb (91.2 kg)  03/31/17 207 lb (93.9 kg)    Physical Exam  Constitutional: He is oriented to person, place, and time. He appears well-developed and well-nourished. No distress.  Eyes: Conjunctivae are normal. No scleral icterus.  Neck: Neck supple. No thyromegaly present.  Cardiovascular: Normal rate, regular rhythm, normal heart sounds and intact distal pulses.   No murmur heard. Pulmonary/Chest: Effort normal and breath sounds normal. No respiratory distress. He has no wheezes. He has no rales.  Musculoskeletal: Normal range of motion. He exhibits no edema.  Lymphadenopathy:    He has no cervical adenopathy.  Neurological: He is alert and oriented to person, place, and time. Coordination normal.  Skin: Skin is warm and dry. No rash noted. He is not diaphoretic.  Psychiatric: He has a normal mood and affect. His behavior is normal.  Nursing note and vitals reviewed.      Assessment & Plan:   Problem List Items Addressed This Visit      Cardiovascular and Mediastinum   Essential hypertension     Endocrine   Type 2 diabetes mellitus (HCC) - Primary   Hyperlipidemia associated with type 2 diabetes mellitus (HCC)      Continue current medications for now.  Follow up plan: Return in about 3 months (around 09/21/2017), or if symptoms worsen or fail to improve, for Diabetes and  hypertension.  Counseling provided for all of the vaccine components No orders of the defined types were placed in this encounter.   Arville CareJoshua Dettinger, MD [facility] 06/21/2017, 4:19 PM

## 2017-08-31 ENCOUNTER — Telehealth: Payer: Self-pay | Admitting: Family Medicine

## 2017-08-31 DIAGNOSIS — E119 Type 2 diabetes mellitus without complications: Secondary | ICD-10-CM

## 2017-08-31 NOTE — Telephone Encounter (Signed)
Pt aware.

## 2017-09-01 ENCOUNTER — Other Ambulatory Visit: Payer: 59

## 2017-09-01 DIAGNOSIS — E119 Type 2 diabetes mellitus without complications: Secondary | ICD-10-CM

## 2017-09-01 LAB — CMP14+EGFR
ALT: 20 IU/L (ref 0–44)
AST: 15 IU/L (ref 0–40)
Albumin/Globulin Ratio: 1.6 (ref 1.2–2.2)
Albumin: 4.2 g/dL (ref 3.5–5.5)
Alkaline Phosphatase: 76 IU/L (ref 39–117)
BILIRUBIN TOTAL: 0.4 mg/dL (ref 0.0–1.2)
BUN/Creatinine Ratio: 20 (ref 9–20)
BUN: 17 mg/dL (ref 6–24)
CALCIUM: 9.5 mg/dL (ref 8.7–10.2)
CHLORIDE: 103 mmol/L (ref 96–106)
CO2: 25 mmol/L (ref 20–29)
Creatinine, Ser: 0.87 mg/dL (ref 0.76–1.27)
GFR calc non Af Amer: 97 mL/min/{1.73_m2} (ref >59)
GFR, EST AFRICAN AMERICAN: 112 mL/min/{1.73_m2} (ref >59)
GLUCOSE: 156 mg/dL — AB (ref 65–99)
Globulin, Total: 2.6 g/dL (ref 1.5–4.5)
Potassium: 4.5 mmol/L (ref 3.5–5.2)
Sodium: 145 mmol/L — ABNORMAL HIGH (ref 134–144)
TOTAL PROTEIN: 6.8 g/dL (ref 6.0–8.5)

## 2017-09-01 LAB — BAYER DCA HB A1C WAIVED: HB A1C (BAYER DCA - WAIVED): 6.9 % (ref <7.0)

## 2017-09-07 ENCOUNTER — Ambulatory Visit (INDEPENDENT_AMBULATORY_CARE_PROVIDER_SITE_OTHER): Payer: 59 | Admitting: Family Medicine

## 2017-09-07 ENCOUNTER — Encounter: Payer: Self-pay | Admitting: Family Medicine

## 2017-09-07 VITALS — BP 113/65 | HR 80 | Temp 97.2°F | Ht 65.0 in | Wt 203.0 lb

## 2017-09-07 DIAGNOSIS — L2089 Other atopic dermatitis: Secondary | ICD-10-CM

## 2017-09-07 DIAGNOSIS — E1169 Type 2 diabetes mellitus with other specified complication: Secondary | ICD-10-CM | POA: Diagnosis not present

## 2017-09-07 DIAGNOSIS — I10 Essential (primary) hypertension: Secondary | ICD-10-CM

## 2017-09-07 DIAGNOSIS — Z23 Encounter for immunization: Secondary | ICD-10-CM | POA: Diagnosis not present

## 2017-09-07 DIAGNOSIS — E1159 Type 2 diabetes mellitus with other circulatory complications: Secondary | ICD-10-CM

## 2017-09-07 DIAGNOSIS — E119 Type 2 diabetes mellitus without complications: Secondary | ICD-10-CM | POA: Diagnosis not present

## 2017-09-07 DIAGNOSIS — E785 Hyperlipidemia, unspecified: Secondary | ICD-10-CM

## 2017-09-07 MED ORDER — TRIAMCINOLONE ACETONIDE 0.1 % EX CREA: 1.0000 "application " | TOPICAL_CREAM | Freq: Two times a day (BID) | CUTANEOUS | 1 refills | Status: DC

## 2017-09-07 MED ORDER — GLIMEPIRIDE 4 MG PO TABS: ORAL_TABLET | ORAL | 1 refills | Status: DC

## 2017-09-07 MED ORDER — DULAGLUTIDE 1.5 MG/0.5ML ~~LOC~~ SOAJ: 1.5000 mg | SUBCUTANEOUS | 1 refills | Status: DC

## 2017-09-07 MED ORDER — LISINOPRIL 10 MG PO TABS: 10.0000 mg | ORAL_TABLET | Freq: Every day | ORAL | 1 refills | Status: DC

## 2017-09-07 NOTE — Progress Notes (Signed)
BP 113/65   Pulse 80   Temp (!) 97.2 F (36.2 C) (Oral)   Ht 5\' 5"  (1.651 m)   Wt 203 lb (92.1 kg)   BMI 33.78 kg/m    Subjective:    Patient ID: Antonio Fisher, male    DOB: 1961/06/18, 56 y.o.   MRN: [id number]  HPI: Antonio Fisher is a 56 y.o. male presenting on 09/07/2017 for Diabetes (pt here today for routine follow up of his chronic medical conditions)   HPI Hypertension Patient is currently on lisinopril, and their blood pressure today is 113/65, he is more on the lisinopril to help with the diabetes protection of the kidneys. Patient denies any lightheadedness or dizziness. Patient denies headaches, blurred vision, chest pains, shortness of breath, or weakness. Denies any side effects from medication and is content with current medication.   Type 2 diabetes mellitus Patient comes in today for recheck of his diabetes. Patient has been currently taking Jardiance and glimepiride and metformin, patient says the [place] is too much for him and it is making him pee all the time and he cannot make it to the restroom at times.  He has been trying to see if it will improve but it is just not getting any better and he is ready to top it and try something different. Patient is currently on an ACE inhibitor/ARB. Patient has not seen an ophthalmologist this year. Patient denies any issues with their feet.   Hyperlipidemia Patient is coming in for recheck of his hyperlipidemia. The patient is currently taking Lipitor. They deny any issues with myalgias or history of liver damage from it. They deny any focal numbness or weakness or chest pain.   Rash Patient has a rash on his left inner calf that he has been fighting off and on.  He uses hydrocortisone cream for it frequently which helps but then it will return.  He is wondering if he can get something stronger to try to see if it can help with that.  He denies any redness or warmth or pain with it but it is sometimes  pruritic.  Relevant past medical, surgical, family and social history reviewed and updated as indicated. Interim medical history since our last visit reviewed. Allergies and medications reviewed and updated.  Review of Systems  Constitutional: Negative for chills and fever.  Respiratory: Negative for cough, shortness of breath and wheezing.   Cardiovascular: Negative for chest pain, palpitations and leg swelling.  Gastrointestinal: Negative for abdominal pain, blood in stool, constipation and diarrhea.  Genitourinary: Positive for frequency and urgency. Negative for decreased urine volume, difficulty urinating, dysuria and hematuria.  Skin: Positive for rash.  Neurological: Negative for dizziness, weakness and headaches.    Per HPI unless specifically indicated above     Objective:    BP 113/65   Pulse 80   Temp (!) 97.2 F (36.2 C) (Oral)   Ht 5\' 5"  (1.651 m)   Wt 203 lb (92.1 kg)   BMI 33.78 kg/m   Wt Readings from Last 3 Encounters:  09/07/17 203 lb (92.1 kg)  06/21/17 204 lb (92.5 kg)  05/01/17 201 lb (91.2 kg)    Physical Exam  Constitutional: He is oriented to person, place, and time. He appears well-developed and well-nourished. No distress.  Eyes: Conjunctivae are normal. No scleral icterus.  Cardiovascular: Normal rate, regular rhythm, normal heart sounds and intact distal pulses.   No murmur heard. Pulmonary/Chest: Effort normal and breath sounds normal. No  respiratory distress. He has no wheezes. He has no rales.  Abdominal: Soft. Bowel sounds are normal. He exhibits no distension. There is no tenderness. There is no rebound.  Musculoskeletal: Normal range of motion. He exhibits no edema.  Neurological: He is alert and oriented to person, place, and time. Coordination normal.  Skin: Skin is warm and dry. Rash noted. Rash is macular (Macular rash with a slight scale to it on left inner thigh, consistent with atopic dermatitis.). He is not diaphoretic.   Psychiatric: He has a normal mood and affect. His behavior is normal.  Nursing note and vitals reviewed.     Assessment & Plan:   Problem List Items Addressed This Visit      Cardiovascular and Mediastinum   Hypertension associated with diabetes (HCC) - Primary   Relevant Medications   Dulaglutide (TRULICITY) 1.5 MG/0.5ML SOPN   lisinopril (PRINIVIL,ZESTRIL) 10 MG tablet   glimepiride (AMARYL) 4 MG tablet     Endocrine   Type 2 diabetes mellitus (HCC)   Relevant Medications   Dulaglutide (TRULICITY) 1.5 MG/0.5ML SOPN   lisinopril (PRINIVIL,ZESTRIL) 10 MG tablet   glimepiride (AMARYL) 4 MG tablet   Hyperlipidemia associated with type 2 diabetes mellitus (HCC)   Relevant Medications   Dulaglutide (TRULICITY) 1.5 MG/0.5ML SOPN   lisinopril (PRINIVIL,ZESTRIL) 10 MG tablet   glimepiride (AMARYL) 4 MG tablet    Other Visit Diagnoses    Other atopic dermatitis       Small patch on left leg, atopic dermatitis, give triamcinolone   Relevant Medications   triamcinolone cream (KENALOG) 0.1 %   Need for immunization against influenza       Relevant Orders   Flu Vaccine QUAD 36+ mos IM (Completed)       Follow up plan: Return in about 3 months (around 12/08/2017), or if symptoms worsen or fail to improve, for Recheck diabetes.  Counseling provided for all of the vaccine components No orders of the defined types were placed in this encounter.   Arville CareJoshua Orpha Dain, MD [facility] 09/07/2017, 4:11 PM

## 2017-10-25 ENCOUNTER — Telehealth: Payer: Self-pay | Admitting: Family Medicine

## 2017-10-26 NOTE — Telephone Encounter (Signed)
I am okay to give him samples for Trulicity, if he wants to start it start with a lower dose for the first 2 weeks and then go up to 1.5 mg after that.

## 2017-10-27 ENCOUNTER — Telehealth: Payer: Self-pay | Admitting: Family Medicine

## 2017-10-27 ENCOUNTER — Other Ambulatory Visit: Payer: Self-pay

## 2017-10-27 DIAGNOSIS — E119 Type 2 diabetes mellitus without complications: Secondary | ICD-10-CM

## 2017-10-27 MED ORDER — DULAGLUTIDE 1.5 MG/0.5ML ~~LOC~~ SOAJ: 1.5000 mg | SUBCUTANEOUS | 1 refills | Status: DC

## 2017-10-27 NOTE — Telephone Encounter (Signed)
What is the name of the medication? Dulaglutide (TRULICITY) 1.5 MG/0.5ML SOPN  Have you contacted your pharmacy to request a refill? yes  Which pharmacy would you like this sent to? [facility] in [place]   Patient notified that their request is being sent to the clinical staff for review and that they should receive a call once it is complete. If they do not receive a call within 24 hours they can check with their pharmacy or our office.

## 2017-10-27 NOTE — Telephone Encounter (Signed)
NA no VM, Trulicity sample for patient is in the refrigerator in the lab with his name on it.

## 2017-10-27 NOTE — Telephone Encounter (Signed)
Came by to pick up Trulicity sample

## 2017-10-30 MED ORDER — DULAGLUTIDE 1.5 MG/0.5ML ~~LOC~~ SOAJ: 1.5000 mg | SUBCUTANEOUS | 1 refills | Status: DC

## 2017-10-30 NOTE — Telephone Encounter (Signed)
Sent to [facility]

## 2017-10-30 NOTE — Telephone Encounter (Signed)
Go ahead and give him a 10136-month supply

## 2017-11-13 ENCOUNTER — Other Ambulatory Visit: Payer: Self-pay | Admitting: Family Medicine

## 2017-12-07 ENCOUNTER — Other Ambulatory Visit: Payer: 59

## 2017-12-07 DIAGNOSIS — E1169 Type 2 diabetes mellitus with other specified complication: Secondary | ICD-10-CM

## 2017-12-07 DIAGNOSIS — E119 Type 2 diabetes mellitus without complications: Secondary | ICD-10-CM

## 2017-12-07 DIAGNOSIS — E785 Hyperlipidemia, unspecified: Secondary | ICD-10-CM

## 2017-12-07 DIAGNOSIS — I10 Essential (primary) hypertension: Principal | ICD-10-CM

## 2017-12-07 DIAGNOSIS — E1159 Type 2 diabetes mellitus with other circulatory complications: Secondary | ICD-10-CM

## 2017-12-07 LAB — BAYER DCA HB A1C WAIVED: HB A1C (BAYER DCA - WAIVED): 6.1 % (ref <7.0)

## 2017-12-08 LAB — CMP14+EGFR
ALBUMIN: 4.6 g/dL (ref 3.5–5.5)
ALT: 31 IU/L (ref 0–44)
AST: 22 IU/L (ref 0–40)
Albumin/Globulin Ratio: 2.2 (ref 1.2–2.2)
Alkaline Phosphatase: 71 IU/L (ref 39–117)
BUN / CREAT RATIO: 13 (ref 9–20)
BUN: 11 mg/dL (ref 6–24)
Bilirubin Total: 0.6 mg/dL (ref 0.0–1.2)
CALCIUM: 9.4 mg/dL (ref 8.7–10.2)
CO2: 25 mmol/L (ref 20–29)
CREATININE: 0.87 mg/dL (ref 0.76–1.27)
Chloride: 106 mmol/L (ref 96–106)
GFR calc non Af Amer: 97 mL/min/{1.73_m2} (ref >59)
GFR, EST AFRICAN AMERICAN: 112 mL/min/{1.73_m2} (ref >59)
GLOBULIN, TOTAL: 2.1 g/dL (ref 1.5–4.5)
Glucose: 153 mg/dL — ABNORMAL HIGH (ref 65–99)
Potassium: 4.4 mmol/L (ref 3.5–5.2)
SODIUM: 147 mmol/L — AB (ref 134–144)
Total Protein: 6.7 g/dL (ref 6.0–8.5)

## 2017-12-08 LAB — CBC WITH DIFFERENTIAL/PLATELET
BASOS: 1 %
Basophils Absolute: 0.1 10*3/uL (ref 0.0–0.2)
EOS (ABSOLUTE): 0.4 10*3/uL (ref 0.0–0.4)
EOS: 5 %
HEMATOCRIT: 43.5 % (ref 37.5–51.0)
Hemoglobin: 15 g/dL (ref 13.0–17.7)
IMMATURE GRANULOCYTES: 0 %
Immature Grans (Abs): 0 10*3/uL (ref 0.0–0.1)
LYMPHS ABS: 2.1 10*3/uL (ref 0.7–3.1)
Lymphs: 27 %
MCH: 28.4 pg (ref 26.6–33.0)
MCHC: 34.5 g/dL (ref 31.5–35.7)
MCV: 82 fL (ref 79–97)
MONOS ABS: 0.4 10*3/uL (ref 0.1–0.9)
Monocytes: 5 %
Neutrophils Absolute: 4.9 10*3/uL (ref 1.4–7.0)
Neutrophils: 62 %
Platelets: 234 10*3/uL (ref 150–379)
RBC: 5.28 x10E6/uL (ref 4.14–5.80)
RDW: 15 % (ref 12.3–15.4)
WBC: 7.9 10*3/uL (ref 3.4–10.8)

## 2017-12-08 LAB — LIPID PANEL
Chol/HDL Ratio: 2.3 ratio (ref 0.0–5.0)
Cholesterol, Total: 93 mg/dL — ABNORMAL LOW (ref 100–199)
HDL: 41 mg/dL (ref >39)
LDL Calculated: 41 mg/dL (ref 0–99)
Triglycerides: 57 mg/dL (ref 0–149)
VLDL Cholesterol Cal: 11 mg/dL (ref 5–40)

## 2017-12-11 ENCOUNTER — Ambulatory Visit (INDEPENDENT_AMBULATORY_CARE_PROVIDER_SITE_OTHER): Payer: 59 | Admitting: Family Medicine

## 2017-12-11 ENCOUNTER — Encounter: Payer: Self-pay | Admitting: Family Medicine

## 2017-12-11 VITALS — BP 134/86 | HR 72 | Temp 98.5°F | Ht 65.0 in | Wt 207.0 lb

## 2017-12-11 DIAGNOSIS — E1159 Type 2 diabetes mellitus with other circulatory complications: Secondary | ICD-10-CM | POA: Diagnosis not present

## 2017-12-11 DIAGNOSIS — E1169 Type 2 diabetes mellitus with other specified complication: Secondary | ICD-10-CM

## 2017-12-11 DIAGNOSIS — E785 Hyperlipidemia, unspecified: Secondary | ICD-10-CM

## 2017-12-11 DIAGNOSIS — I10 Essential (primary) hypertension: Secondary | ICD-10-CM

## 2017-12-11 DIAGNOSIS — N529 Male erectile dysfunction, unspecified: Secondary | ICD-10-CM | POA: Diagnosis not present

## 2017-12-11 DIAGNOSIS — E119 Type 2 diabetes mellitus without complications: Secondary | ICD-10-CM | POA: Diagnosis not present

## 2017-12-11 NOTE — Progress Notes (Signed)
BP 134/86   Pulse 72   Temp 98.5 F (36.9 C) (Oral)   Ht '5\' 5"'  (1.651 m)   Wt 207 lb (93.9 kg)   BMI 34.45 kg/m    Subjective:    Patient ID: Antonio Fisher, male    DOB: [birth year], 57 y.o.   MRN: [id number]  HPI: Antonio Fisher is a 57 y.o. male presenting on 12/11/2017 for Diabetes; Hyperlipidemia; and Hypertension   HPI Type 2 diabetes mellitus Patient comes in today for recheck of his diabetes. Patient has been currently taking Trulicity and glimepiride and metformin, he had his blood work done 4 days ago and his A1c was 6.1. Patient is currently on an ACE inhibitor/ARB. Patient has not seen an ophthalmologist this year. Patient denies any issues with their feet.   Hypertension Patient is currently on lisinopril, and their blood pressure today is 134/86. Patient denies any lightheadedness or dizziness. Patient denies headaches, blurred vision, chest pains, shortness of breath, or weakness. Denies any side effects from medication and is content with current medication.   Hyperlipidemia Patient is coming in for recheck of his hyperlipidemia. The patient is currently taking Lipitor. They deny any issues with myalgias or history of liver damage from it. They deny any focal numbness or weakness or chest pain.   Erectile dysfunction Patient has difficulty getting and sustaining an erection and this is been going on for about a year.  He says that it is becoming more problematic for his marriage and he wants to know if there is something that could help.  He says he is tried both Viagra and Cialis and neither 1 of them did anything for him and he would like to try other options if possible.  She denies any pain in his penis or pain with urination or blood in his urine.  Relevant past medical, surgical, family and social history reviewed and updated as indicated. Interim medical history since our last visit reviewed. Allergies and medications reviewed and updated.  Review of  Systems  Constitutional: Negative for chills and fever.  Eyes: Negative for discharge.  Respiratory: Negative for shortness of breath and wheezing.   Cardiovascular: Negative for chest pain and leg swelling.  Musculoskeletal: Negative for back pain and gait problem.  Skin: Negative for rash.  Neurological: Negative for dizziness, weakness and numbness.  All other systems reviewed and are negative.   Per HPI unless specifically indicated above   Allergies as of 12/11/2017   No Known Allergies     Medication List        Accurate as of 12/11/17  4:16 PM. Always use your most recent med list.          atorvastatin 40 MG tablet Commonly known as:  LIPITOR TAKE 1 TABLET BY MOUTH ONCE DAILY   Dulaglutide 1.5 MG/0.5ML Sopn Commonly known as:  TRULICITY Inject 1.5 mg into the skin once a week. Give a 97-monthsupply   glimepiride 4 MG tablet Commonly known as:  AMARYL TAKE ONE TABLET BY MOUTH ONCE DAILY BEFORE BREAKFAST   lisinopril 10 MG tablet Commonly known as:  PRINIVIL,ZESTRIL Take 1 tablet (10 mg total) by mouth daily.   metFORMIN 1000 MG tablet Commonly known as:  GLUCOPHAGE Take 1 tablet (1,000 mg total) by mouth 2 (two) times daily with a meal.          Objective:    BP 134/86   Pulse 72   Temp 98.5 F (36.9 C) (Oral)  Ht '5\' 5"'  (1.651 m)   Wt 207 lb (93.9 kg)   BMI 34.45 kg/m   Wt Readings from Last 3 Encounters:  12/11/17 207 lb (93.9 kg)  09/07/17 203 lb (92.1 kg)  06/21/17 204 lb (92.5 kg)    Physical Exam  Constitutional: He is oriented to person, place, and time. He appears well-developed and well-nourished. No distress.  Eyes: Conjunctivae are normal. No scleral icterus.  Neck: Neck supple. No thyromegaly present.  Cardiovascular: Normal rate, regular rhythm, normal heart sounds and intact distal pulses.  No murmur heard. Pulmonary/Chest: Effort normal and breath sounds normal. No respiratory distress. He has no wheezes. He has no rales.    Musculoskeletal: Normal range of motion. He exhibits no edema.  Lymphadenopathy:    He has no cervical adenopathy.  Neurological: He is alert and oriented to person, place, and time. Coordination normal.  Skin: Skin is warm and dry. No rash noted. He is not diaphoretic.  Psychiatric: He has a normal mood and affect. His behavior is normal.  Nursing note and vitals reviewed.   Results for orders placed or performed in visit on 12/07/17  CMP14+EGFR  Result Value Ref Range   Glucose 153 (H) 65 - 99 mg/dL   BUN 11 6 - 24 mg/dL   Creatinine, Ser 0.87 0.76 - 1.27 mg/dL   GFR calc non Af Amer 97 >59 mL/min/1.73   GFR calc Af Amer 112 >59 mL/min/1.73   BUN/Creatinine Ratio 13 9 - 20   Sodium 147 (H) 134 - 144 mmol/L   Potassium 4.4 3.5 - 5.2 mmol/L   Chloride 106 96 - 106 mmol/L   CO2 25 20 - 29 mmol/L   Calcium 9.4 8.7 - 10.2 mg/dL   Total Protein 6.7 6.0 - 8.5 g/dL   Albumin 4.6 3.5 - 5.5 g/dL   Globulin, Total 2.1 1.5 - 4.5 g/dL   Albumin/Globulin Ratio 2.2 1.2 - 2.2   Bilirubin Total 0.6 0.0 - 1.2 mg/dL   Alkaline Phosphatase 71 39 - 117 IU/L   AST 22 0 - 40 IU/L   ALT 31 0 - 44 IU/L  CBC with Differential/Platelet  Result Value Ref Range   WBC 7.9 3.4 - 10.8 x10E3/uL   RBC 5.28 4.14 - 5.80 x10E6/uL   Hemoglobin 15.0 13.0 - 17.7 g/dL   Hematocrit 43.5 37.5 - 51.0 %   MCV 82 79 - 97 fL   MCH 28.4 26.6 - 33.0 pg   MCHC 34.5 31.5 - 35.7 g/dL   RDW 15.0 12.3 - 15.4 %   Platelets 234 150 - 379 x10E3/uL   Neutrophils 62 Not Estab. %   Lymphs 27 Not Estab. %   Monocytes 5 Not Estab. %   Eos 5 Not Estab. %   Basos 1 Not Estab. %   Neutrophils Absolute 4.9 1.4 - 7.0 x10E3/uL   Lymphocytes Absolute 2.1 0.7 - 3.1 x10E3/uL   Monocytes Absolute 0.4 0.1 - 0.9 x10E3/uL   EOS (ABSOLUTE) 0.4 0.0 - 0.4 x10E3/uL   Basophils Absolute 0.1 0.0 - 0.2 x10E3/uL   Immature Granulocytes 0 Not Estab. %   Immature Grans (Abs) 0.0 0.0 - 0.1 x10E3/uL  Lipid panel  Result Value Ref Range    Cholesterol, Total 93 (L) 100 - 199 mg/dL   Triglycerides 57 0 - 149 mg/dL   HDL 41 >39 mg/dL   VLDL Cholesterol Cal 11 5 - 40 mg/dL   LDL Calculated 41 0 - 99 mg/dL   Chol/HDL Ratio  2.3 0.0 - 5.0 ratio  Bayer DCA Hb A1c Waived  Result Value Ref Range   Bayer DCA Hb A1c Waived 6.1 <7.0 %      Assessment & Plan:   Problem List Items Addressed This Visit      Cardiovascular and Mediastinum   Hypertension associated with diabetes ([place])     Endocrine   Type 2 diabetes mellitus ([place]) - Primary   Relevant Orders   Bayer DCA Hb A1c Waived   Hyperlipidemia associated with type 2 diabetes mellitus ([place])    Other Visit Diagnoses    Erectile dysfunction, unspecified erectile dysfunction type       Relevant Orders   Ambulatory referral to Urology       Follow up plan: Return in about 3 months (around 03/11/2018), or if symptoms worsen or fail to improve, for Diabetes recheck.  Counseling provided for all of the vaccine components Orders Placed This Encounter  Procedures  . [facility] Hb A1c [place], MD [facility] 12/11/2017, 4:16 PM

## 2018-02-18 ENCOUNTER — Other Ambulatory Visit: Payer: Self-pay | Admitting: Family Medicine

## 2018-03-07 ENCOUNTER — Ambulatory Visit (INDEPENDENT_AMBULATORY_CARE_PROVIDER_SITE_OTHER): Payer: 59 | Admitting: Family Medicine

## 2018-03-07 ENCOUNTER — Encounter: Payer: Self-pay | Admitting: Family Medicine

## 2018-03-07 ENCOUNTER — Ambulatory Visit (INDEPENDENT_AMBULATORY_CARE_PROVIDER_SITE_OTHER): Payer: 59

## 2018-03-07 VITALS — BP 145/82 | HR 89 | Ht 65.0 in | Wt 199.1 lb

## 2018-03-07 DIAGNOSIS — M25562 Pain in left knee: Secondary | ICD-10-CM

## 2018-03-07 MED ORDER — DICLOFENAC SODIUM 75 MG PO TBEC: 75.0000 mg | DELAYED_RELEASE_TABLET | Freq: Two times a day (BID) | ORAL | 2 refills | Status: DC

## 2018-03-07 NOTE — Progress Notes (Signed)
Subjective:  Patient ID: Antonio Fisher, male    DOB: Sep 24, 1961  Age: 57 y.o. MRN: [id number]  CC: Knee Pain (pt here today c/o left knee pain for the past 2 weeks )   HPI Antonio Fisher presents 2 weeks of pain in the left knee.   [facility] 12/11/2017 09/07/2017 05/01/2017  Decreased Interest 0 0 0  Down, Depressed, Hopeless 0 0 0  PHQ - 2 Score 0 0 0    History Antonio Fisher has a past medical history of Diabetes mellitus without complication (HCC), Gout, and Hypertension.   He has a past surgical history that includes Hand surgery (Right).   His family history includes Diabetes in his father and paternal grandmother; Early death in his maternal grandfather; Heart attack in his maternal grandmother; Heart disease in his brother.He reports that he has quit smoking. His smokeless tobacco use includes chew. He reports that he does not drink alcohol or use drugs.    ROS Review of Systems  Objective:  BP (!) 145/82   Pulse 89   Ht 5\' 5"  (1.651 m)   Wt 199 lb 2 oz (90.3 kg)   BMI 33.14 kg/m   BP Readings from Last 3 Encounters:  03/07/18 (!) 145/82  12/11/17 134/86  09/07/17 113/65    Wt Readings from Last 3 Encounters:  03/07/18 199 lb 2 oz (90.3 kg)  12/11/17 207 lb (93.9 kg)  09/07/17 203 lb (92.1 kg)     Physical Exam  The left knee has full range of motion with moderate discomfort actively and passively. Gait is normal with a limp. The joint lines are nontender laterally, but moderately tender medially. The patella is palpable without tenderness or edema.  Lachman / anterior drawer signs are negative for signs of instability and pain free. McMurray testing reveals no pop or excessive discomfort. Varus and valgus stress maneuvers do not cause ligamentous stretch or instability.   Assessment & Plan:   Antonio Fisher was seen today for knee pain.  Diagnoses and all orders for this visit:  Acute pain of left knee -     DG Knee 1-2 Views Left;  Future  Other orders -     diclofenac (VOLTAREN) 75 MG EC tablet; Take 1 tablet (75 mg total) by mouth 2 (two) times daily. For muscle and  Joint pain    Patient was advised to use heat frequently, alternate with ice.  When on his feet he should use the hinged knee brace that was fitted today.  Follow-up 2 to 3 weeks if not significantly improved.   I am having Antonio Fisher start on diclofenac. I am also having him maintain his metFORMIN, lisinopril, glimepiride, Dulaglutide, and atorvastatin.  Allergies as of 03/07/2018   No Known Allergies     Medication List        Accurate as of 03/07/18 11:59 PM. Always use your most recent med list.          atorvastatin 40 MG tablet Commonly known as:  LIPITOR TAKE 1 TABLET BY MOUTH ONCE DAILY   diclofenac 75 MG EC tablet Commonly known as:  VOLTAREN Take 1 tablet (75 mg total) by mouth 2 (two) times daily. For muscle and  Joint pain   Dulaglutide 1.5 MG/0.5ML Sopn Commonly known as:  TRULICITY Inject 1.5 mg into the skin once a week. Give a 3729-month supply   glimepiride 4 MG tablet Commonly known as:  AMARYL TAKE ONE TABLET BY MOUTH ONCE DAILY  BEFORE BREAKFAST   lisinopril 10 MG tablet Commonly known as:  PRINIVIL,ZESTRIL Take 1 tablet (10 mg total) by mouth daily.   metFORMIN 1000 MG tablet Commonly known as:  GLUCOPHAGE Take 1 tablet (1,000 mg total) by mouth 2 (two) times daily with a meal.        Follow-up: Return if symptoms worsen or fail to improve.  Mechele Claude, M.D.

## 2018-03-14 ENCOUNTER — Encounter: Payer: Self-pay | Admitting: Family Medicine

## 2018-03-19 ENCOUNTER — Ambulatory Visit (INDEPENDENT_AMBULATORY_CARE_PROVIDER_SITE_OTHER): Payer: 59 | Admitting: Family Medicine

## 2018-03-19 ENCOUNTER — Encounter: Payer: Self-pay | Admitting: Family Medicine

## 2018-03-19 VITALS — BP 123/77 | HR 81 | Temp 98.5°F | Ht 65.0 in | Wt 199.0 lb

## 2018-03-19 DIAGNOSIS — E1169 Type 2 diabetes mellitus with other specified complication: Secondary | ICD-10-CM

## 2018-03-19 DIAGNOSIS — E1159 Type 2 diabetes mellitus with other circulatory complications: Secondary | ICD-10-CM

## 2018-03-19 DIAGNOSIS — E785 Hyperlipidemia, unspecified: Secondary | ICD-10-CM

## 2018-03-19 DIAGNOSIS — E119 Type 2 diabetes mellitus without complications: Secondary | ICD-10-CM | POA: Diagnosis not present

## 2018-03-19 DIAGNOSIS — I10 Essential (primary) hypertension: Secondary | ICD-10-CM | POA: Diagnosis not present

## 2018-03-19 LAB — BAYER DCA HB A1C WAIVED: HB A1C (BAYER DCA - WAIVED): 6.1 % (ref <7.0)

## 2018-03-19 MED ORDER — LISINOPRIL 2.5 MG PO TABS: 2.5000 mg | ORAL_TABLET | Freq: Every day | ORAL | 1 refills | Status: DC

## 2018-03-19 MED ORDER — GLIMEPIRIDE 4 MG PO TABS: ORAL_TABLET | ORAL | 1 refills | Status: DC

## 2018-03-19 MED ORDER — LISINOPRIL 10 MG PO TABS: 10.0000 mg | ORAL_TABLET | Freq: Every day | ORAL | 1 refills | Status: DC

## 2018-03-19 NOTE — Progress Notes (Signed)
BP 123/77   Pulse 81   Temp 98.5 F (36.9 C) (Oral)   Ht  (1.651 m)   Wt 199 lb (90.3 kg)   BMI 33.12 kg/m    Subjective:    Patient ID: Antonio Fisher, male    DOB: 02/18/1961, 57 y.o.   MRN: [id number]  HPI: Antonio Fisher is a 57 y.o. male presenting on 03/19/2018 for Diabetes (3 mo); Hyperlipidemia; Hypertension; and Left knee pain (follow up, seen 2 weeks ago)   HPI Type 2 diabetes mellitus Patient comes in today for recheck of his diabetes. Patient has been currently taking Trulicity and glimepiride and metformin, he wants to stop 1 of them if possible if his A1c is looking good. Patient is currently on an ACE inhibitor/ARB. Patient has not seen an ophthalmologist this year. Patient denies any issues with their feet.   Hyperlipidemia Patient is coming in for recheck of his hyperlipidemia. The patient is currently taking Lipitor. They deny any issues with myalgias or history of liver damage from it. They deny any focal numbness or weakness or chest pain.   Hypertension Patient is currently on lisinopril, he says he feels like the medication makes him weak and he wants to reduce it or stop it.  He denies any chronic cough or lightheadedness or dizziness., and their blood pressure today is 123/77. Patient denies any lightheadedness or dizziness. Patient denies headaches, blurred vision, chest pains, shortness of breath, or weakness. Denies any side effects from medication and is content with current medication.   Relevant past medical, surgical, family and social history reviewed and updated as indicated. Interim medical history since our last visit reviewed. Allergies and medications reviewed and updated.  Review of Systems  Constitutional: Positive for fatigue. Negative for chills and fever.  Eyes: Negative for discharge.  Respiratory: Negative for shortness of breath and wheezing.   Cardiovascular: Negative for chest pain and leg swelling.  Musculoskeletal: Negative  for back pain and gait problem.  Skin: Negative for rash.  Neurological: Positive for weakness. Negative for dizziness, light-headedness, numbness and headaches.  All other systems reviewed and are negative.   Per HPI unless specifically indicated above   Allergies as of 03/19/2018   No Known Allergies     Medication List        Accurate as of 03/19/18  5:06 PM. Always use your most recent med list.          atorvastatin 40 MG tablet Commonly known as:  LIPITOR TAKE 1 TABLET BY MOUTH ONCE DAILY   Dulaglutide 1.5 MG/0.5ML Sopn Commonly known as:  TRULICITY Inject 1.5 mg into the skin once a week. Give a 69-month supply   glimepiride 4 MG tablet Commonly known as:  AMARYL TAKE ONE TABLET BY MOUTH ONCE DAILY BEFORE BREAKFAST   lisinopril 2.5 MG tablet Commonly known as:  PRINIVIL,ZESTRIL Take 1 tablet (2.5 mg total) by mouth daily.   metFORMIN 1000 MG tablet Commonly known as:  GLUCOPHAGE Take 1 tablet (1,000 mg total) by mouth 2 (two) times daily with a meal.          Objective:    BP 123/77   Pulse 81   Temp 98.5 F (36.9 C) (Oral)   Ht  (1.651 m)   Wt 199 lb (90.3 kg)   BMI 33.12 kg/m   Wt Readings from Last 3 Encounters:  03/19/18 199 lb (90.3 kg)  03/07/18 199 lb 2 oz (90.3 kg)  12/11/17 207 lb (  93.9 kg)    Physical Exam  Constitutional: He is oriented to person, place, and time. He appears well-developed and well-nourished. No distress.  Eyes: Conjunctivae are normal. No scleral icterus.  Neck: Neck supple. No thyromegaly present.  Cardiovascular: Normal rate, regular rhythm, normal heart sounds and intact distal pulses.  No murmur heard. Pulmonary/Chest: Effort normal and breath sounds normal. No respiratory distress. He has no wheezes.  Musculoskeletal: Normal range of motion. He exhibits no edema.  Lymphadenopathy:    He has no cervical adenopathy.  Neurological: He is alert and oriented to person, place, and time. Coordination normal.    Skin: Skin is warm and dry. No rash noted. He is not diaphoretic.  Psychiatric: He has a normal mood and affect. His behavior is normal.  Nursing note and vitals reviewed.       Assessment & Plan:   Problem List Items Addressed This Visit      Cardiovascular and Mediastinum   Hypertension associated with diabetes (HCC)   Relevant Medications   glimepiride (AMARYL) 4 MG tablet   lisinopril (PRINIVIL,ZESTRIL) 2.5 MG tablet     Endocrine   Type 2 diabetes mellitus (HCC) - Primary   Relevant Medications   glimepiride (AMARYL) 4 MG tablet   lisinopril (PRINIVIL,ZESTRIL) 2.5 MG tablet   Other Relevant Orders   Bayer DCA Hb A1c Waived (Completed)   Hyperlipidemia associated with type 2 diabetes mellitus (HCC)   Relevant Medications   glimepiride (AMARYL) 4 MG tablet   lisinopril (PRINIVIL,ZESTRIL) 2.5 MG tablet       Follow up plan: Return in about 3 months (around 06/19/2018), or if symptoms worsen or fail to improve, for Diabetes and hypertension.  Counseling provided for all of the vaccine components Orders Placed This Encounter  Procedures  . [facility] Hb A1c Waived    Arville Care, MD [facility] 03/19/2018, 5:06 PM

## 2018-03-20 ENCOUNTER — Telehealth: Payer: Self-pay | Admitting: Family Medicine

## 2018-03-20 NOTE — Telephone Encounter (Signed)
Aware of results. 

## 2018-05-09 ENCOUNTER — Other Ambulatory Visit: Payer: Self-pay | Admitting: Family Medicine

## 2018-05-29 ENCOUNTER — Other Ambulatory Visit: Payer: Self-pay | Admitting: Family Medicine

## 2018-05-30 NOTE — Telephone Encounter (Signed)
Last lipid 12/07/17 

## 2018-06-13 ENCOUNTER — Telehealth: Payer: Self-pay | Admitting: Family Medicine

## 2018-06-13 DIAGNOSIS — I10 Essential (primary) hypertension: Principal | ICD-10-CM

## 2018-06-13 DIAGNOSIS — E119 Type 2 diabetes mellitus without complications: Secondary | ICD-10-CM

## 2018-06-13 DIAGNOSIS — E785 Hyperlipidemia, unspecified: Secondary | ICD-10-CM

## 2018-06-13 DIAGNOSIS — E1169 Type 2 diabetes mellitus with other specified complication: Secondary | ICD-10-CM

## 2018-06-13 DIAGNOSIS — I152 Hypertension secondary to endocrine disorders: Secondary | ICD-10-CM

## 2018-06-13 DIAGNOSIS — E1159 Type 2 diabetes mellitus with other circulatory complications: Secondary | ICD-10-CM

## 2018-06-14 NOTE — Telephone Encounter (Signed)
Left message advising pt lab orders placed and to call back with any further questions or concerns.

## 2018-06-14 NOTE — Telephone Encounter (Signed)
I have placed the orders.  

## 2018-06-15 ENCOUNTER — Other Ambulatory Visit: Payer: 59

## 2018-06-15 DIAGNOSIS — I10 Essential (primary) hypertension: Secondary | ICD-10-CM

## 2018-06-15 DIAGNOSIS — E119 Type 2 diabetes mellitus without complications: Secondary | ICD-10-CM

## 2018-06-15 DIAGNOSIS — E1159 Type 2 diabetes mellitus with other circulatory complications: Secondary | ICD-10-CM

## 2018-06-15 LAB — BAYER DCA HB A1C WAIVED: HB A1C: 10.2 % — AB (ref <7.0)

## 2018-06-16 ENCOUNTER — Emergency Department (HOSPITAL_COMMUNITY): Payer: 59

## 2018-06-16 ENCOUNTER — Encounter (HOSPITAL_COMMUNITY): Payer: Self-pay

## 2018-06-16 ENCOUNTER — Other Ambulatory Visit: Payer: Self-pay

## 2018-06-16 ENCOUNTER — Emergency Department (HOSPITAL_COMMUNITY)
Admission: EM | Admit: 2018-06-16 | Discharge: 2018-06-16 | Disposition: A | Discharge status: Home / Self Care | Payer: 59 | Attending: Emergency Medicine | Admitting: Emergency Medicine

## 2018-06-16 DIAGNOSIS — R7401 Elevation of levels of liver transaminase levels: Secondary | ICD-10-CM

## 2018-06-16 DIAGNOSIS — E1165 Type 2 diabetes mellitus with hyperglycemia: Secondary | ICD-10-CM | POA: Insufficient documentation

## 2018-06-16 DIAGNOSIS — Z79899 Other long term (current) drug therapy: Secondary | ICD-10-CM | POA: Insufficient documentation

## 2018-06-16 DIAGNOSIS — Z87891 Personal history of nicotine dependence: Secondary | ICD-10-CM | POA: Insufficient documentation

## 2018-06-16 DIAGNOSIS — Z7984 Long term (current) use of oral hypoglycemic drugs: Secondary | ICD-10-CM | POA: Insufficient documentation

## 2018-06-16 DIAGNOSIS — I10 Essential (primary) hypertension: Secondary | ICD-10-CM | POA: Diagnosis not present

## 2018-06-16 DIAGNOSIS — R739 Hyperglycemia, unspecified: Secondary | ICD-10-CM

## 2018-06-16 DIAGNOSIS — R740 Nonspecific elevation of levels of transaminase and lactic acid dehydrogenase [LDH]: Secondary | ICD-10-CM

## 2018-06-16 DIAGNOSIS — R7989 Other specified abnormal findings of blood chemistry: Secondary | ICD-10-CM | POA: Diagnosis not present

## 2018-06-16 LAB — LIPID PANEL
CHOL/HDL RATIO: 3.2 ratio (ref 0.0–5.0)
Cholesterol, Total: 96 mg/dL — ABNORMAL LOW (ref 100–199)
HDL: 30 mg/dL — ABNORMAL LOW (ref >39)
LDL Calculated: 36 mg/dL (ref 0–99)
Triglycerides: 152 mg/dL — ABNORMAL HIGH (ref 0–149)
VLDL Cholesterol Cal: 30 mg/dL (ref 5–40)

## 2018-06-16 LAB — COMPREHENSIVE METABOLIC PANEL
ALBUMIN: 4.2 g/dL (ref 3.5–5.0)
ALT: 428 U/L — AB (ref 0–44)
AST: 201 U/L — ABNORMAL HIGH (ref 15–41)
Alkaline Phosphatase: 122 U/L (ref 38–126)
Anion gap: 11 (ref 5–15)
BUN: 30 mg/dL — ABNORMAL HIGH (ref 6–20)
CHLORIDE: 95 mmol/L — AB (ref 98–111)
CO2: 26 mmol/L (ref 22–32)
CREATININE: 1.38 mg/dL — AB (ref 0.61–1.24)
Calcium: 8.8 mg/dL — ABNORMAL LOW (ref 8.9–10.3)
GFR calc Af Amer: >60 mL/min (ref >60)
GFR calc non Af Amer: 56 mL/min — ABNORMAL LOW (ref >60)
GLUCOSE: 460 mg/dL — AB (ref 70–99)
POTASSIUM: 5.7 mmol/L — AB (ref 3.5–5.1)
Sodium: 132 mmol/L — ABNORMAL LOW (ref 135–145)
Total Bilirubin: 1.4 mg/dL — ABNORMAL HIGH (ref 0.3–1.2)
Total Protein: 7.3 g/dL (ref 6.5–8.1)

## 2018-06-16 LAB — URINALYSIS, ROUTINE W REFLEX MICROSCOPIC
BACTERIA UA: NONE SEEN
BILIRUBIN URINE: NEGATIVE
Glucose, UA: >=500 mg/dL — AB
HGB URINE DIPSTICK: NEGATIVE
Ketones, ur: NEGATIVE mg/dL
LEUKOCYTES UA: NEGATIVE
NITRITE: NEGATIVE
PROTEIN: NEGATIVE mg/dL
SPECIFIC GRAVITY, URINE: 1.026 (ref 1.005–1.030)
pH: 5 (ref 5.0–8.0)

## 2018-06-16 LAB — CMP14+EGFR
A/G RATIO: 1.8 (ref 1.2–2.2)
ALBUMIN: 4.4 g/dL (ref 3.5–5.5)
ALT: 444 IU/L — ABNORMAL HIGH (ref 0–44)
AST: 266 IU/L — ABNORMAL HIGH (ref 0–40)
Alkaline Phosphatase: 124 IU/L — ABNORMAL HIGH (ref 39–117)
BILIRUBIN TOTAL: 1.2 mg/dL (ref 0.0–1.2)
BUN / CREAT RATIO: 18 (ref 9–20)
BUN: 21 mg/dL (ref 6–24)
CALCIUM: 9.7 mg/dL (ref 8.7–10.2)
CHLORIDE: 97 mmol/L (ref 96–106)
CO2: 24 mmol/L (ref 20–29)
Creatinine, Ser: 1.14 mg/dL (ref 0.76–1.27)
GFR calc Af Amer: 83 mL/min/{1.73_m2} (ref >59)
GFR, EST NON AFRICAN AMERICAN: 71 mL/min/{1.73_m2} (ref >59)
GLOBULIN, TOTAL: 2.5 g/dL (ref 1.5–4.5)
Glucose: 405 mg/dL (ref 65–99)
POTASSIUM: 5.9 mmol/L — AB (ref 3.5–5.2)
Sodium: 137 mmol/L (ref 134–144)
TOTAL PROTEIN: 6.9 g/dL (ref 6.0–8.5)

## 2018-06-16 LAB — BASIC METABOLIC PANEL
Anion gap: 10 (ref 5–15)
BUN: 29 mg/dL — ABNORMAL HIGH (ref 6–20)
CHLORIDE: 100 mmol/L (ref 98–111)
CO2: 25 mmol/L (ref 22–32)
Calcium: 8.2 mg/dL — ABNORMAL LOW (ref 8.9–10.3)
Creatinine, Ser: 1.08 mg/dL (ref 0.61–1.24)
GFR calc non Af Amer: >60 mL/min (ref >60)
Glucose, Bld: 362 mg/dL — ABNORMAL HIGH (ref 70–99)
Potassium: 4.6 mmol/L (ref 3.5–5.1)
Sodium: 135 mmol/L (ref 135–145)

## 2018-06-16 LAB — CBG MONITORING, ED
GLUCOSE-CAPILLARY: 287 mg/dL — AB (ref 70–99)
Glucose-Capillary: 378 mg/dL — ABNORMAL HIGH (ref 70–99)
Glucose-Capillary: 293 mg/dL — ABNORMAL HIGH (ref 70–99)

## 2018-06-16 LAB — CBC WITH DIFFERENTIAL/PLATELET
BASOS ABS: 0.1 10*3/uL (ref 0.0–0.1)
BASOS PCT: 1 %
Eosinophils Absolute: 0.6 10*3/uL (ref 0.0–0.7)
Eosinophils Relative: 7 %
HEMATOCRIT: 40.3 % (ref 39.0–52.0)
HEMOGLOBIN: 14.1 g/dL (ref 13.0–17.0)
Lymphocytes Relative: 18 %
Lymphs Abs: 1.6 10*3/uL (ref 0.7–4.0)
MCH: 28.6 pg (ref 26.0–34.0)
MCHC: 35 g/dL (ref 30.0–36.0)
MCV: 81.7 fL (ref 78.0–100.0)
Monocytes Absolute: 0.8 10*3/uL (ref 0.1–1.0)
Monocytes Relative: 9 %
NEUTROS ABS: 5.8 10*3/uL (ref 1.7–7.7)
NEUTROS PCT: 65 %
Platelets: 247 10*3/uL (ref 150–400)
RBC: 4.93 MIL/uL (ref 4.22–5.81)
RDW: 13.8 % (ref 11.5–15.5)
WBC: 8.9 10*3/uL (ref 4.0–10.5)

## 2018-06-16 LAB — ACETAMINOPHEN LEVEL

## 2018-06-16 LAB — TROPONIN I: Troponin I: <0.03 ng/mL (ref <0.03)

## 2018-06-16 MED ORDER — INSULIN ASPART PROT & ASPART (70-30 MIX) 100 UNIT/ML ~~LOC~~ SUSP
5.0000 [IU] | Freq: Once | SUBCUTANEOUS | Status: AC
  Administered 2018-06-16: 5 [IU] via SUBCUTANEOUS
  Filled 2018-06-16 (×2): qty 10

## 2018-06-16 MED ORDER — INSULIN ASPART PROT & ASPART (70-30 MIX) 100 UNIT/ML ~~LOC~~ SUSP
10.0000 [IU] | Freq: Once | SUBCUTANEOUS | Status: DC
  Filled 2018-06-16: qty 10

## 2018-06-16 MED ORDER — SODIUM CHLORIDE 0.9 % IV BOLUS
1000.0000 mL | Freq: Once | INTRAVENOUS | Status: AC
  Administered 2018-06-16: 1000 mL via INTRAVENOUS

## 2018-06-16 NOTE — ED Provider Notes (Signed)
Physical Exam  BP 101/67 (BP Location: Left Arm)   Pulse 63   Temp 98.2 F (36.8 C) (Oral)   Resp 12   SpO2 99%   [facility] from [facility] PA-C at 4:32 PM. Briefly, the patient is a 57 y.o. male with PMHx of  has a past medical history of Diabetes mellitus without complication (HCC), Gout, and Hypertension. here with abnormal labs.  In particular, patient had an elevated potassium from PCP, as well as slight hyponatremia, and elevated liver enzymes, AST 201 and ALT 428.  On my evaluation, acute hepatitis panel is in process, acetaminophen level is less than 10.  Labs Reviewed  COMPREHENSIVE METABOLIC PANEL - Abnormal; Notable for the following components:      Result Value   Sodium 132 (*)    Potassium 5.7 (*)    Chloride 95 (*)    Glucose, Bld 460 (*)    BUN 30 (*)    Creatinine, Ser 1.38 (*)    Calcium 8.8 (*)    AST 201 (*)    ALT 428 (*)    Total Bilirubin 1.4 (*)    GFR calc non Af Amer 56 (*)    All other components within normal limits  URINALYSIS, ROUTINE W REFLEX MICROSCOPIC - Abnormal; Notable for the following components:   Color, Urine AMBER (*)    Glucose, UA >=500 (*)    All other components within normal limits  ACETAMINOPHEN LEVEL - Abnormal; Notable for the following components:   Acetaminophen (Tylenol), Serum <10 (*)    All other components within normal limits  BASIC METABOLIC PANEL - Abnormal; Notable for the following components:   Glucose, Bld 362 (*)    BUN 29 (*)    Calcium 8.2 (*)    All other components within normal limits  CBG MONITORING, ED - Abnormal; Notable for the following components:   Glucose-Capillary 378 (*)    All other components within normal limits  CBC WITH DIFFERENTIAL/PLATELET  TROPONIN I  HEPATITIS PANEL, ACUTE    Course of Care:   Physical Exam  Constitutional: He appears well-developed and well-nourished. No distress.  Sitting comfortably in bed.  HENT:  Head: Normocephalic and atraumatic.  Eyes:  Conjunctivae are normal. Right eye exhibits no discharge. Left eye exhibits no discharge.  EOMs normal to gross examination.  Neck: Normal range of motion.  Cardiovascular: Normal rate, regular rhythm and normal heart sounds.  Intact, 2+ radial pulse.  Pulmonary/Chest: Effort normal and breath sounds normal. He has no wheezes. He has no rales.  Normal respiratory effort. Patient converses comfortably. No audible wheeze or stridor.  Abdominal: He exhibits no distension.  Musculoskeletal: Normal range of motion.  Neurological: He is alert.  Cranial nerves intact to gross observation. Patient moves extremities without difficulty.  Skin: Skin is warm and dry. He is not diaphoretic.  Psychiatric: He has a normal mood and affect. His behavior is normal. Judgment and thought content normal.  Nursing note and vitals reviewed.   ED Course/Procedures     Procedures  MDM   Normalization of labs after 1L NS. Will f/u w/ PCP. Abnormal EKG-no ischemic CP picture but will f/u with PCP. Repeat CBG after insulin.  CBG below 300 after fluids and 5 units of insulin.  Patient asymptomatic.  I discussed with patient to follow-up with PCP, which occurs in 4 days, and [place]  Patient to return for any chest pain, shortness breath, palpitations, abdominal pain, nausea,  vomiting, fevers.  Patient is in understanding and agrees with plan of care.      Elisha Ponder, PA-C 06/16/18 [place]    Shaune Pollack, MD 06/16/18 2351

## 2018-06-16 NOTE — ED Provider Notes (Signed)
[facility]-EMERGENCY DEPT Provider Note   CSN: [id number] Arrival date & time: 06/16/18  0931     History   Chief Complaint Chief Complaint  Patient presents with  . Abnormal Lab    HPI Antonio Fisher is a 57 y.o. male with a past medical history of hypertension, diabetes who presents to ED for evaluation of abnormal lab work.  He is scheduled for an appointment with his PCP in 4 days.  He was told to complete fasting labs yesterday.  He was called today and was told that his potassium and liver labs were high and he needed to come to the ED for further evaluation.  Patient does endorse polyuria and polydipsia lately.  He reports compliance with his home diabetes medications but states that his sugars have been running high up to the 400s fasting.  He does report vomiting that began 3 weeks ago but has resolved.  He denies any alcohol or chronic acetaminophen use.  He had never been told that he had any liver or gallbladder issues in the past.  Denies any itching or jaundice.  He does take a pain medication nightly which he believes is an NSAID as prescribed by his PCP for right arm pain.  Denies any fevers, abdominal pain, changes in bowel movements, chest pain, back pain.  HPI  Past Medical History:  Diagnosis Date  . Diabetes mellitus without complication (HCC)   . Gout   . Hypertension     Patient Active Problem List   Diagnosis Date Noted  . Hyperlipidemia associated with type 2 diabetes mellitus (HCC) 03/20/2017  . Hypertension associated with diabetes (HCC) 09/23/2016  . Rhinitis, allergic 07/31/2015  . Type 2 diabetes mellitus (HCC) 11/12/2014    Past Surgical History:  Procedure Laterality Date  . HAND SURGERY Right         Home Medications    Prior to Admission medications   Medication Sig Start Date End Date Taking? Authorizing Provider  atorvastatin (LIPITOR) 40 MG tablet TAKE 1 TABLET BY MOUTH ONCE DAILY 05/30/18   Dettinger, Elige Radon,  MD  Dulaglutide (TRULICITY) 1.5 MG/0.5ML SOPN Inject 1.5 mg into the skin once a week. Give a 22-month supply 10/30/17   Dettinger, Elige Radon, MD  glimepiride (AMARYL) 4 MG tablet TAKE ONE TABLET BY MOUTH ONCE DAILY BEFORE BREAKFAST 03/19/18   Dettinger, Elige Radon, MD  lisinopril (PRINIVIL,ZESTRIL) 2.5 MG tablet Take 1 tablet (2.5 mg total) by mouth daily. 03/19/18   Dettinger, Elige Radon, MD  metFORMIN (GLUCOPHAGE) 1000 MG tablet TAKE 1 TABLET BY MOUTH TWICE DAILY WITH MEALS 05/10/18   Dettinger, Elige Radon, MD    Family History Family History  Problem Relation Age of Onset  . Diabetes Father   . Heart disease Brother   . Heart attack Maternal Grandmother   . Early death Maternal Grandfather   . Diabetes Paternal Grandmother     Social History Social History   Tobacco Use  . Smoking status: Former Games developer  . Smokeless tobacco: Current User    Types: Chew  Substance Use Topics  . Alcohol use: No    Alcohol/week: 0.0 oz  . Drug use: No     Allergies   Patient has no known allergies.   Review of Systems Review of Systems  Constitutional: Negative for appetite change, chills and fever.  HENT: Negative for ear pain, rhinorrhea, sneezing and sore throat.   Eyes: Negative for photophobia and visual disturbance.  Respiratory: Negative for cough,  chest tightness, shortness of breath and wheezing.   Cardiovascular: Negative for chest pain and palpitations.  Gastrointestinal: Negative for abdominal pain, blood in stool, constipation, diarrhea, nausea and vomiting.  Endocrine: Positive for polydipsia and polyuria.  Genitourinary: Negative for dysuria, hematuria and urgency.  Musculoskeletal: Negative for myalgias.  Skin: Negative for rash.  Neurological: Negative for dizziness, weakness and light-headedness.     Physical Exam Updated Vital Signs BP 101/67 (BP Location: Left Arm)   Pulse 63   Temp 98.2 F (36.8 C) (Oral)   Resp 12   SpO2 99%   Physical Exam  Constitutional: He  appears well-developed and well-nourished. No distress.  HENT:  Head: Normocephalic and atraumatic.  Nose: Nose normal.  Eyes: Conjunctivae and EOM are normal. Right eye exhibits no discharge. Left eye exhibits no discharge. No scleral icterus.  Neck: Normal range of motion. Neck supple.  Cardiovascular: Normal rate, regular rhythm, normal heart sounds and intact distal pulses. Exam reveals no gallop and no friction rub.  No murmur heard. Pulmonary/Chest: Effort normal and breath sounds normal. No respiratory distress.  Abdominal: Soft. Bowel sounds are normal. He exhibits no distension. There is no tenderness. There is no guarding.  No abdominal tenderness to palpation.  Musculoskeletal: Normal range of motion. He exhibits no edema.  Neurological: He is alert. He exhibits normal muscle tone. Coordination normal.  Skin: Skin is warm and dry. No rash noted.  Psychiatric: He has a normal mood and affect.  Nursing note and vitals reviewed.    ED Treatments / Results  Labs (all labs ordered are listed, but only abnormal results are displayed) Labs Reviewed  COMPREHENSIVE METABOLIC PANEL - Abnormal; Notable for the following components:      Result Value   Sodium 132 (*)    Potassium 5.7 (*)    Chloride 95 (*)    Glucose, Bld 460 (*)    BUN 30 (*)    Creatinine, Ser 1.38 (*)    Calcium 8.8 (*)    AST 201 (*)    ALT 428 (*)    Total Bilirubin 1.4 (*)    GFR calc non Af Amer 56 (*)    All other components within normal limits  URINALYSIS, ROUTINE W REFLEX MICROSCOPIC - Abnormal; Notable for the following components:   Color, Urine AMBER (*)    Glucose, UA >=500 (*)    All other components within normal limits  ACETAMINOPHEN LEVEL - Abnormal; Notable for the following components:   Acetaminophen (Tylenol), Serum <10 (*)    All other components within normal limits  BASIC METABOLIC PANEL - Abnormal; Notable for the following components:   Glucose, Bld 362 (*)    BUN 29 (*)     Calcium 8.2 (*)    All other components within normal limits  CBG MONITORING, ED - Abnormal; Notable for the following components:   Glucose-Capillary 378 (*)    All other components within normal limits  CBC WITH DIFFERENTIAL/PLATELET  TROPONIN I  HEPATITIS PANEL, ACUTE  CBG MONITORING, ED    EKG EKG Interpretation  Date/Time:  Saturday June 16 2018 13:35:23 EDT Ventricular Rate:  58 PR Interval:    QRS Duration: 83 QT Interval:  410 QTC Calculation: 403 R Axis:   55 Text Interpretation:  Sinus rhythm Inferior infarct, acute (LCx) Minimal ST elevation, anterior leads Lateral leads are also involved No old tracing to compare Confirmed by Rolan BuccoBelfi, Melanie [phone]([id number]) on 06/16/2018 1:38:16 PM   Radiology Dg Chest 2 View  Result  Date: 06/16/2018 CLINICAL DATA:  Elevated LFTs EXAM: CHEST - 2 VIEW COMPARISON:  None. FINDINGS: Lungs are clear.  No pleural effusion or pneumothorax. The heart is normal in size. Visualized osseous structures are within normal limits. IMPRESSION: Normal chest radiographs. Electronically Signed   By: Charline Bills M.D.   On: 06/16/2018 14:14   US Abdomen Limited Ruq  Result Date: 06/16/2018 CLINICAL DATA:  Elevated ALT. EXAM: ULTRASOUND ABDOMEN LIMITED RIGHT UPPER QUADRANT COMPARISON:  None. FINDINGS: Gallbladder: Gallbladder has a normal appearance. Gallbladder wall is 1.7 millimeters, within normal limits. No stones or pericholecystic fluid. No sonographic Murphy's sign. Common bile duct: Diameter: 2.4 millimeters Liver: No focal lesion identified. Within normal limits in parenchymal echogenicity. Portal vein is patent on color Doppler imaging with normal direction of blood flow towards the liver. IMPRESSION: Normal evaluation the RIGHT UPPER QUADRANT. Electronically Signed   By: Norva Pavlov M.D.   On: 06/16/2018 14:10    Procedures Procedures (including critical care time)  Medications Ordered in ED Medications  insulin aspart protamine- aspart  (NOVOLOG MIX 70/30) injection 5 Units (has no administration in time range)  sodium chloride 0.9 % bolus 1,000 mL (0 mLs Intravenous Stopped 06/16/18 1434)     Initial Impression / Assessment and Plan / ED Course  I have reviewed the triage vital signs and the nursing notes.  Pertinent labs & imaging results that were available during my care of the patient were reviewed by me and considered in my medical decision making (see chart for details).     57 year old male past medical history of hypertension, diabetes presents to ED for evaluation of abnormal lab work.  He is scheduled for an appointment with his PCP in 4 days.  He was told to complete fasting labs yesterday.  He was called and told that his potassium and liver labs were high and he needed to come to the ED for further evaluation.  He does endorse polyuria and polydipsia lately.  Reports compliance with his home diabetes medications including metformin and Trulicity.  Denies any abdominal pain,.  He denies alcohol or acetaminophen use.  On physical exam he is overall well-appearing.  No abdominal tenderness to palpation.  I reviewed lab work from yesterday showing potassium level of 5.9, elevated AST, ALT and alkaline phosphatase.  EKG shows ST elevation however not concerning for MI per Dr. Christoper Fabian reading.  Patient denies any chest pain or shortness of breath.  Troponin unremarkable.  CBC unremarkable.  Urinalysis with no evidence of UTI.  CMP done today shows increase in AST, ALT, potassium of 5.7, glucose of 460, creatinine of 1.3 and BUN of 30.  Hepatitis panel is pending.  Right upper quadrant ultrasound is negative for acute abnormality.  Chest x-ray is normal.  Repeat BMP after IV fluid bolus shows normal potassium and improved creatinine.  Will give patient 5 units of insulin and recheck CBG.  Anticipate disposition home with PCP appointment this week.  He is comfortable with discharge home.  Care handed off to a.Dayton Scrape, PA-C pending  recheck. Patient discussed with and seen by my attending, Dr. Fredderick Phenix.  Portions of this note were generated with Scientist, clinical (histocompatibility and immunogenetics). Dictation errors may occur despite best attempts at proofreading.   Final Clinical Impressions(s) / ED Diagnoses   Final diagnoses:  Elevated ALT measurement  Hyperglycemia    ED Discharge Orders    None       Dietrich Pates, PA-C 06/16/18 1642    Rolan Bucco, MD 06/17/18 [phone]

## 2018-06-16 NOTE — ED Triage Notes (Signed)
He states he saw his pcp recently for a routine (diabetic check) visit; and was phoned by them this morning telling him that he has elevated liver enzymes and they recommended he come to hospital for elucidation. He denies any pain or discomfort. He does c/o mild anorexia. I do not observe any jaundice. He states he was dx with type II D.M. 2 years ago and  In that interval he has had a gradual ~80 lb. Wt. Loss.

## 2018-06-16 NOTE — ED Notes (Signed)
RN aware of CBG @ [id number].

## 2018-06-16 NOTE — Discharge Instructions (Addendum)
Follow-up with your primary care provider for further evaluation regarding your abnormal lab work.  Your hepatitis panel is pending.  You had an abnormal EKG, but this is nonspecific.  Will need follow-up with cardiology.  You should be hearing from the cardiology clinic I listed above.  If you have not heard in 1 week, please give them a call.  Please return for any chest pain, shortness of breath, feeling that her heart is racing, nausea, vomiting, fevers, or any new or worsening symptoms.

## 2018-06-17 LAB — HEPATITIS PANEL, ACUTE
HCV Ab: <0.1 s/co ratio (ref 0.0–0.9)
HEP A IGM: NEGATIVE
HEP B C IGM: NEGATIVE
HEP B S AG: NEGATIVE

## 2018-06-20 ENCOUNTER — Ambulatory Visit (INDEPENDENT_AMBULATORY_CARE_PROVIDER_SITE_OTHER): Payer: 59 | Admitting: Family Medicine

## 2018-06-20 ENCOUNTER — Encounter: Payer: Self-pay | Admitting: Family Medicine

## 2018-06-20 VITALS — BP 115/71 | HR 78 | Temp 97.0°F | Ht 65.0 in | Wt 186.4 lb

## 2018-06-20 DIAGNOSIS — I10 Essential (primary) hypertension: Secondary | ICD-10-CM

## 2018-06-20 DIAGNOSIS — E1159 Type 2 diabetes mellitus with other circulatory complications: Secondary | ICD-10-CM | POA: Diagnosis not present

## 2018-06-20 DIAGNOSIS — R7989 Other specified abnormal findings of blood chemistry: Secondary | ICD-10-CM

## 2018-06-20 DIAGNOSIS — R945 Abnormal results of liver function studies: Secondary | ICD-10-CM | POA: Diagnosis not present

## 2018-06-20 DIAGNOSIS — E119 Type 2 diabetes mellitus without complications: Secondary | ICD-10-CM

## 2018-06-20 MED ORDER — DULAGLUTIDE 0.75 MG/0.5ML ~~LOC~~ SOAJ: 0.7500 mg | SUBCUTANEOUS | 3 refills | Status: DC

## 2018-06-20 MED ORDER — LISINOPRIL 2.5 MG PO TABS: 2.5000 mg | ORAL_TABLET | Freq: Every day | ORAL | 1 refills | Status: DC

## 2018-06-20 MED ORDER — GLIMEPIRIDE 4 MG PO TABS: ORAL_TABLET | ORAL | 1 refills | Status: DC

## 2018-06-20 MED ORDER — METFORMIN HCL 1000 MG PO TABS: 1000.0000 mg | ORAL_TABLET | Freq: Two times a day (BID) | ORAL | 3 refills | Status: DC

## 2018-06-20 NOTE — Progress Notes (Signed)
BP 115/71   Pulse 78   Temp (!) 97 F (36.1 C) (Oral)   Ht '5\' 5"'  (1.651 m)   Wt 186 lb 6.4 oz (84.6 kg)   BMI 31.02 kg/m    Subjective:    Patient ID: Antonio Fisher, male    DOB: [birth year], 57 y.o.   MRN: [id number]  HPI: Antonio Fisher is a 57 y.o. male presenting on 06/20/2018 for Diabetes (3 month follow up. Patient states sometimes taking Trulicty patient "thows it up ".) and Hypertension   HPI Follow-up for elevated liver function Patient is coming in for follow-up for elevated liver function.  His liver function was almost 10 times normal but had returned back down slightly on his last lab draw in the emergency department on Saturday.  He was sent to the emergency department from our office because of concern about how much his liver function been elevated.  They did a scan and did not find any abnormalities in the liver.  We will recheck these levels today.  Patient says is been having some troubles with the Trulicity and the 1.5 mg is making him sick to stomach every time that he takes it and would like to consider other options today.  He says he gets nauseated and has some diarrhea every time and also throws it up.  His diabetes is been running more out-of-control recently because of this.  Hypertension Patient is currently on lisinopril, and their blood pressure today is 115/71. Patient denies any lightheadedness or dizziness. Patient denies headaches, blurred vision, chest pains, shortness of breath, or weakness. Denies any side effects from medication and is content with current medication.   Relevant past medical, surgical, family and social history reviewed and updated as indicated. Interim medical history since our last visit reviewed. Allergies and medications reviewed and updated.  Review of Systems  Constitutional: Negative for chills and fever.  Eyes: Negative for visual disturbance.  Respiratory: Negative for shortness of breath and wheezing.     Cardiovascular: Negative for chest pain and leg swelling.  Gastrointestinal: Positive for nausea and vomiting.  Musculoskeletal: Negative for back pain and gait problem.  Skin: Negative for rash.  Neurological: Negative for dizziness, weakness, light-headedness and headaches.  All other systems reviewed and are negative.   Per HPI unless specifically indicated above   Allergies as of 06/20/2018   No Known Allergies     Medication List        Accurate as of 06/20/18  4:50 PM. Always use your most recent med list.          atorvastatin 40 MG tablet Commonly known as:  LIPITOR TAKE 1 TABLET BY MOUTH ONCE DAILY   diclofenac 75 MG EC tablet Commonly known as:  VOLTAREN TAKE 1 TABLET BY MOUTH TWICE DAILY FOR MUSCLE AND JOINT PAIN   Dulaglutide 0.75 MG/0.5ML Sopn Commonly known as:  TRULICITY Inject 9.17 mg into the skin once a week.   glimepiride 4 MG tablet Commonly known as:  AMARYL TAKE ONE TABLET BY MOUTH ONCE DAILY BEFORE BREAKFAST   lisinopril 2.5 MG tablet Commonly known as:  PRINIVIL,ZESTRIL Take 1 tablet (2.5 mg total) by mouth daily.   metFORMIN 1000 MG tablet Commonly known as:  GLUCOPHAGE Take 1 tablet (1,000 mg total) by mouth 2 (two) times daily with a meal.          Objective:    BP 115/71   Pulse 78   Temp (!) 97 F (36.1 C) (  Oral)   Ht '5\' 5"'  (1.651 m)   Wt 186 lb 6.4 oz (84.6 kg)   BMI 31.02 kg/m   Wt Readings from Last 3 Encounters:  06/20/18 186 lb 6.4 oz (84.6 kg)  03/19/18 199 lb (90.3 kg)  03/07/18 199 lb 2 oz (90.3 kg)    Physical Exam  Constitutional: He is oriented to person, place, and time. He appears well-developed and well-nourished. No distress.  Eyes: Conjunctivae are normal. No scleral icterus.  Neck: Neck supple. No thyromegaly present.  Cardiovascular: Normal rate, regular rhythm, normal heart sounds and intact distal pulses.  No murmur heard. Pulmonary/Chest: Effort normal and breath sounds normal. No respiratory  distress. He has no wheezes.  Abdominal: Soft. Bowel sounds are normal. He exhibits no distension. There is no tenderness. There is no guarding.  Musculoskeletal: Normal range of motion. He exhibits no edema.  Lymphadenopathy:    He has no cervical adenopathy.  Neurological: He is alert and oriented to person, place, and time. Coordination normal.  Skin: Skin is warm and dry. No rash noted. He is not diaphoretic.  Psychiatric: He has a normal mood and affect. His behavior is normal.  Nursing note and vitals reviewed.   Results for orders placed or performed during the hospital encounter of 06/16/18  Comprehensive metabolic panel  Result Value Ref Range   Sodium 132 (L) 135 - 145 mmol/L   Potassium 5.7 (H) 3.5 - 5.1 mmol/L   Chloride 95 (L) 98 - 111 mmol/L   CO2 26 22 - 32 mmol/L   Glucose, Bld 460 (H) 70 - 99 mg/dL   BUN 30 (H) 6 - 20 mg/dL   Creatinine, Ser 1.38 (H) 0.61 - 1.24 mg/dL   Calcium 8.8 (L) 8.9 - 10.3 mg/dL   Total Protein 7.3 6.5 - 8.1 g/dL   Albumin 4.2 3.5 - 5.0 g/dL   AST 201 (H) 15 - 41 U/L   ALT 428 (H) 0 - 44 U/L   Alkaline Phosphatase 122 38 - 126 U/L   Total Bilirubin 1.4 (H) 0.3 - 1.2 mg/dL   GFR calc non Af Amer 56 (L) >60 mL/min   GFR calc Af Amer >60 >60 mL/min   Anion gap 11 5 - 15  CBC with Differential  Result Value Ref Range   WBC 8.9 4.0 - 10.5 K/uL   RBC 4.93 4.22 - 5.81 MIL/uL   Hemoglobin 14.1 13.0 - 17.0 g/dL   HCT 40.3 39.0 - 52.0 %   MCV 81.7 78.0 - 100.0 fL   MCH 28.6 26.0 - 34.0 pg   MCHC 35.0 30.0 - 36.0 g/dL   RDW 13.8 11.5 - 15.5 %   Platelets 247 150 - 400 K/uL   Neutrophils Relative % 65 %   Neutro Abs 5.8 1.7 - 7.7 K/uL   Lymphocytes Relative 18 %   Lymphs Abs 1.6 0.7 - 4.0 K/uL   Monocytes Relative 9 %   Monocytes Absolute 0.8 0.1 - 1.0 K/uL   Eosinophils Relative 7 %   Eosinophils Absolute 0.6 0.0 - 0.7 K/uL   Basophils Relative 1 %   Basophils Absolute 0.1 0.0 - 0.1 K/uL  Urinalysis, Routine w reflex microscopic    Result Value Ref Range   Color, Urine AMBER (A) YELLOW   APPearance CLEAR CLEAR   Specific Gravity, Urine 1.026 1.005 - 1.030   pH 5.0 5.0 - 8.0   Glucose, UA >=500 (A) NEGATIVE mg/dL   Hgb urine dipstick NEGATIVE NEGATIVE  Bilirubin Urine NEGATIVE NEGATIVE   Ketones, ur NEGATIVE NEGATIVE mg/dL   Protein, ur NEGATIVE NEGATIVE mg/dL   Nitrite NEGATIVE NEGATIVE   Leukocytes, UA NEGATIVE NEGATIVE   RBC / HPF 0-5 0 - 5 RBC/hpf   WBC, UA 0-5 0 - 5 WBC/hpf   Bacteria, UA NONE SEEN NONE SEEN   Squamous Epithelial / LPF 0-5 0 - 5   Hyaline Casts, UA PRESENT   Acetaminophen level  Result Value Ref Range   Acetaminophen (Tylenol), Serum <10 (L) 10 - 30 ug/mL  Troponin I  Result Value Ref Range   Troponin I <0.03 <0.03 ng/mL  Hepatitis panel, acute  Result Value Ref Range   Hepatitis B Surface Ag Negative Negative   HCV Ab <0.1 0.0 - 0.9 s/co ratio   Hep A IgM Negative Negative   Hep B C IgM Negative Negative  Basic metabolic panel  Result Value Ref Range   Sodium 135 135 - 145 mmol/L   Potassium 4.6 3.5 - 5.1 mmol/L   Chloride 100 98 - 111 mmol/L   CO2 25 22 - 32 mmol/L   Glucose, Bld 362 (H) 70 - 99 mg/dL   BUN 29 (H) 6 - 20 mg/dL   Creatinine, Ser 1.08 0.61 - 1.24 mg/dL   Calcium 8.2 (L) 8.9 - 10.3 mg/dL   GFR calc non Af Amer >60 >60 mL/min   GFR calc Af Amer >60 >60 mL/min   Anion gap 10 5 - 15  POC CBG, ED  Result Value Ref Range   Glucose-Capillary 378 (H) 70 - 99 mg/dL  POC CBG, ED  Result Value Ref Range   Glucose-Capillary 293 (H) 70 - 99 mg/dL  CBG monitoring, ED  Result Value Ref Range   Glucose-Capillary 287 (H) 70 - 99 mg/dL      Assessment & Plan:   Problem List Items Addressed This Visit      Cardiovascular and Mediastinum   Hypertension associated with diabetes ([place])   Relevant Medications   lisinopril (PRINIVIL,ZESTRIL) 2.5 MG tablet   metFORMIN (GLUCOPHAGE) 1000 MG tablet   glimepiride (AMARYL) 4 MG tablet   Dulaglutide (TRULICITY) 1.85  BM/1.5AE SOPN     Endocrine   Type 2 diabetes mellitus (HCC)   Relevant Medications   lisinopril (PRINIVIL,ZESTRIL) 2.5 MG tablet   metFORMIN (GLUCOPHAGE) 1000 MG tablet   glimepiride (AMARYL) 4 MG tablet   Dulaglutide (TRULICITY) 8.25 RK/9.3XL SOPN    Other Visit Diagnoses    Elevated liver function tests    -  Primary   Relevant Orders   CMP14+EGFR (Completed)   CMP14+EGFR   HIV antibody (Completed)   CMV abs, IgG+IgM (cytomegalovirus) (Completed)   Thyroid Panel With TSH (Completed)       Follow up plan: Return in about 3 months (around 09/20/2018), or if symptoms worsen or fail to improve, for hypertension and diabetes.  Counseling provided for all of the vaccine components Orders Placed This Encounter  Procedures  . CMP14+EGFR  . [place] Meiko Stranahan, MD [facility] 06/20/2018, 4:50 PM

## 2018-06-21 ENCOUNTER — Telehealth: Payer: Self-pay | Admitting: *Deleted

## 2018-06-21 LAB — CMP14+EGFR
ALBUMIN: 4.2 g/dL (ref 3.5–5.5)
ALT: 285 IU/L — ABNORMAL HIGH (ref 0–44)
AST: 84 IU/L — ABNORMAL HIGH (ref 0–40)
Albumin/Globulin Ratio: 1.8 (ref 1.2–2.2)
Alkaline Phosphatase: 113 IU/L (ref 39–117)
BUN / CREAT RATIO: 15 (ref 9–20)
BUN: 17 mg/dL (ref 6–24)
Bilirubin Total: 0.7 mg/dL (ref 0.0–1.2)
CALCIUM: 9.4 mg/dL (ref 8.7–10.2)
CO2: 21 mmol/L (ref 20–29)
CREATININE: 1.14 mg/dL (ref 0.76–1.27)
Chloride: 100 mmol/L (ref 96–106)
GFR calc Af Amer: 83 mL/min/{1.73_m2} (ref >59)
GFR, EST NON AFRICAN AMERICAN: 71 mL/min/{1.73_m2} (ref >59)
GLUCOSE: 381 mg/dL — AB (ref 65–99)
Globulin, Total: 2.4 g/dL (ref 1.5–4.5)
Potassium: 4.5 mmol/L (ref 3.5–5.2)
Sodium: 139 mmol/L (ref 134–144)
TOTAL PROTEIN: 6.6 g/dL (ref 6.0–8.5)

## 2018-06-21 LAB — CMV ABS, IGG+IGM (CYTOMEGALOVIRUS)
CMV Ab - IgG: <0.6 U/mL (ref 0.00–0.59)
CMV IgM Ser EIA-aCnc: <30 AU/mL (ref 0.0–29.9)

## 2018-06-21 LAB — THYROID PANEL WITH TSH
Free Thyroxine Index: 1.9 (ref 1.2–4.9)
T3 Uptake Ratio: 22 % — ABNORMAL LOW (ref 24–39)
T4 TOTAL: 8.7 ug/dL (ref 4.5–12.0)
TSH: 1.82 u[IU]/mL (ref 0.450–4.500)

## 2018-06-21 LAB — HIV ANTIBODY (ROUTINE TESTING W REFLEX): HIV SCREEN 4TH GENERATION: NONREACTIVE

## 2018-07-03 ENCOUNTER — Other Ambulatory Visit: Payer: Self-pay | Admitting: *Deleted

## 2018-07-03 DIAGNOSIS — R7989 Other specified abnormal findings of blood chemistry: Secondary | ICD-10-CM

## 2018-07-03 DIAGNOSIS — R945 Abnormal results of liver function studies: Principal | ICD-10-CM

## 2018-07-06 ENCOUNTER — Other Ambulatory Visit: Payer: 59

## 2018-07-06 DIAGNOSIS — R945 Abnormal results of liver function studies: Principal | ICD-10-CM

## 2018-07-06 DIAGNOSIS — R7989 Other specified abnormal findings of blood chemistry: Secondary | ICD-10-CM

## 2018-07-07 LAB — CMP14+EGFR
A/G RATIO: 2 (ref 1.2–2.2)
ALT: 119 IU/L — ABNORMAL HIGH (ref 0–44)
AST: 65 IU/L — ABNORMAL HIGH (ref 0–40)
Albumin: 4.2 g/dL (ref 3.5–5.5)
Alkaline Phosphatase: 83 IU/L (ref 39–117)
BUN/Creatinine Ratio: 13 (ref 9–20)
BUN: 9 mg/dL (ref 6–24)
Bilirubin Total: 0.7 mg/dL (ref 0.0–1.2)
CALCIUM: 9.3 mg/dL (ref 8.7–10.2)
CO2: 23 mmol/L (ref 20–29)
Chloride: 104 mmol/L (ref 96–106)
Creatinine, Ser: 0.7 mg/dL — ABNORMAL LOW (ref 0.76–1.27)
GFR, EST AFRICAN AMERICAN: 122 mL/min/{1.73_m2} (ref >59)
GFR, EST NON AFRICAN AMERICAN: 105 mL/min/{1.73_m2} (ref >59)
GLOBULIN, TOTAL: 2.1 g/dL (ref 1.5–4.5)
Glucose: 244 mg/dL — ABNORMAL HIGH (ref 65–99)
Potassium: 4.9 mmol/L (ref 3.5–5.2)
SODIUM: 142 mmol/L (ref 134–144)
TOTAL PROTEIN: 6.3 g/dL (ref 6.0–8.5)

## 2018-07-09 ENCOUNTER — Telehealth: Payer: Self-pay | Admitting: Family Medicine

## 2018-07-09 NOTE — Telephone Encounter (Signed)
Aware of labs 

## 2018-08-28 ENCOUNTER — Other Ambulatory Visit: Payer: Self-pay | Admitting: Family Medicine

## 2018-08-28 MED ORDER — INDOMETHACIN 25 MG PO CAPS: 25.0000 mg | ORAL_CAPSULE | Freq: Two times a day (BID) | ORAL | 1 refills | Status: DC

## 2018-08-28 NOTE — Telephone Encounter (Signed)
Patient aware that medication sent to pharmacy  

## 2018-08-28 NOTE — Telephone Encounter (Signed)
Please let patient know that I sent in indomethacin for him

## 2018-08-28 NOTE — Telephone Encounter (Signed)
Please review and advise.

## 2018-09-24 ENCOUNTER — Ambulatory Visit: Payer: 59 | Admitting: Family Medicine

## 2018-10-22 ENCOUNTER — Other Ambulatory Visit: Payer: 59

## 2018-10-22 ENCOUNTER — Other Ambulatory Visit: Payer: Self-pay | Admitting: *Deleted

## 2018-10-22 DIAGNOSIS — E1169 Type 2 diabetes mellitus with other specified complication: Secondary | ICD-10-CM

## 2018-10-22 DIAGNOSIS — E1159 Type 2 diabetes mellitus with other circulatory complications: Secondary | ICD-10-CM

## 2018-10-22 DIAGNOSIS — I10 Essential (primary) hypertension: Secondary | ICD-10-CM | POA: Diagnosis not present

## 2018-10-22 DIAGNOSIS — E785 Hyperlipidemia, unspecified: Secondary | ICD-10-CM

## 2018-10-22 DIAGNOSIS — E119 Type 2 diabetes mellitus without complications: Secondary | ICD-10-CM

## 2018-10-22 LAB — BAYER DCA HB A1C WAIVED: HB A1C: 6.3 % (ref <7.0)

## 2018-10-23 LAB — SPECIMEN STATUS

## 2018-10-23 LAB — LIPID PANEL
CHOL/HDL RATIO: 4.2 ratio (ref 0.0–5.0)
Cholesterol, Total: 186 mg/dL (ref 100–199)
HDL: 44 mg/dL (ref >39)
LDL CALC: 125 mg/dL — AB (ref 0–99)
Triglycerides: 87 mg/dL (ref 0–149)
VLDL CHOLESTEROL CAL: 17 mg/dL (ref 5–40)

## 2018-10-23 LAB — CMP14+EGFR
ALT: 15 IU/L (ref 0–44)
AST: 15 IU/L (ref 0–40)
Albumin/Globulin Ratio: 2 (ref 1.2–2.2)
Albumin: 4.4 g/dL (ref 3.5–5.5)
Alkaline Phosphatase: 63 IU/L (ref 39–117)
BUN/Creatinine Ratio: 16 (ref 9–20)
BUN: 15 mg/dL (ref 6–24)
Bilirubin Total: 0.5 mg/dL (ref 0.0–1.2)
CALCIUM: 9.3 mg/dL (ref 8.7–10.2)
CO2: 22 mmol/L (ref 20–29)
Chloride: 102 mmol/L (ref 96–106)
Creatinine, Ser: 0.93 mg/dL (ref 0.76–1.27)
GFR calc Af Amer: 106 mL/min/{1.73_m2} (ref >59)
GFR calc non Af Amer: 91 mL/min/{1.73_m2} (ref >59)
Globulin, Total: 2.2 g/dL (ref 1.5–4.5)
Glucose: 180 mg/dL — ABNORMAL HIGH (ref 65–99)
Potassium: 4.6 mmol/L (ref 3.5–5.2)
Sodium: 142 mmol/L (ref 134–144)
Total Protein: 6.6 g/dL (ref 6.0–8.5)

## 2018-10-23 LAB — THYROID PANEL WITH TSH
Free Thyroxine Index: 1.6 (ref 1.2–4.9)
T3 Uptake Ratio: 26 % (ref 24–39)
T4 TOTAL: 6.3 ug/dL (ref 4.5–12.0)
TSH: 1.81 u[IU]/mL (ref 0.450–4.500)

## 2018-10-24 ENCOUNTER — Encounter: Payer: Self-pay | Admitting: Family Medicine

## 2018-10-24 ENCOUNTER — Ambulatory Visit (INDEPENDENT_AMBULATORY_CARE_PROVIDER_SITE_OTHER): Payer: 59 | Admitting: Family Medicine

## 2018-10-24 VITALS — BP 139/96 | HR 86 | Temp 98.3°F | Ht 65.0 in | Wt 200.0 lb

## 2018-10-24 DIAGNOSIS — E1159 Type 2 diabetes mellitus with other circulatory complications: Secondary | ICD-10-CM | POA: Diagnosis not present

## 2018-10-24 DIAGNOSIS — I10 Essential (primary) hypertension: Secondary | ICD-10-CM

## 2018-10-24 DIAGNOSIS — E785 Hyperlipidemia, unspecified: Secondary | ICD-10-CM | POA: Diagnosis not present

## 2018-10-24 DIAGNOSIS — E1169 Type 2 diabetes mellitus with other specified complication: Secondary | ICD-10-CM

## 2018-10-24 MED ORDER — METFORMIN HCL 1000 MG PO TABS: 1000.0000 mg | ORAL_TABLET | Freq: Two times a day (BID) | ORAL | 3 refills | Status: DC

## 2018-10-24 MED ORDER — DULAGLUTIDE 0.75 MG/0.5ML ~~LOC~~ SOAJ: 0.7500 mg | SUBCUTANEOUS | 3 refills | Status: DC

## 2018-10-24 MED ORDER — GLIMEPIRIDE 4 MG PO TABS: ORAL_TABLET | ORAL | 3 refills | Status: DC

## 2018-10-24 MED ORDER — LISINOPRIL 2.5 MG PO TABS: 2.5000 mg | ORAL_TABLET | Freq: Every day | ORAL | 3 refills | Status: DC

## 2018-10-24 NOTE — Progress Notes (Signed)
BP (!) 139/96   Pulse 86   Temp 98.3 F (36.8 C) (Oral)   Ht 5' 5"  (1.651 m)   Wt 200 lb (90.7 kg)   BMI 33.28 kg/m    Subjective:    Patient ID: Antonio Fisher, male    DOB: 08/26/1961, 57 y.o.   MRN: [id number]  HPI: GREGORY BARRICK is a 57 y.o. male presenting on 10/24/2018 for Medical Management of Chronic Issues (three month recheck) and Diarrhea   HPI Type 2 diabetes mellitus Patient comes in today for recheck of his diabetes. Patient has been currently taking metformin and glimepiride and Trulicity. Patient is currently on an ACE inhibitor/ARB. Patient has not seen an ophthalmologist this year. Patient denies any issues with their feet.  The diarrhea is gone now to lower the Trulicity down to 0.73 and his A1c 2 days ago was 6.3 so he is at a very good dose and will keep a MAC.  Hypertension Patient is currently on lisinopril, and their blood pressure today is 139/96 but he did just come from work where he is physically active right before coming. Patient denies any lightheadedness or dizziness. Patient denies headaches, blurred vision, chest pains, shortness of breath, or weakness. Denies any side effects from medication and is content with current medication.   Hyperlipidemia Patient is coming in for recheck of his hyperlipidemia. The patient is currently taking Lipitor. They deny any issues with myalgias or history of liver damage from it. They deny any focal numbness or weakness or chest pain.   Relevant past medical, surgical, family and social history reviewed and updated as indicated. Interim medical history since our last visit reviewed. Allergies and medications reviewed and updated.  Review of Systems  Constitutional: Negative for chills and fever.  Eyes: Negative for visual disturbance.  Respiratory: Negative for shortness of breath and wheezing.   Cardiovascular: Negative for chest pain and leg swelling.  Musculoskeletal: Negative for back pain and gait  problem.  Skin: Negative for rash.  Neurological: Negative for dizziness, weakness, light-headedness and numbness.  All other systems reviewed and are negative.   Per HPI unless specifically indicated above   Allergies as of 10/24/2018   No Known Allergies     Medication List        Accurate as of 10/24/18  1:11 PM. Always use your most recent med list.          atorvastatin 40 MG tablet Commonly known as:  LIPITOR TAKE 1 TABLET BY MOUTH ONCE DAILY   diclofenac 75 MG EC tablet Commonly known as:  VOLTAREN TAKE 1 TABLET BY MOUTH TWICE DAILY FOR MUSCLE AND JOINT PAIN   Dulaglutide 0.75 MG/0.5ML Sopn Inject 0.75 mg into the skin once a week.   glimepiride 4 MG tablet Commonly known as:  AMARYL TAKE ONE TABLET BY MOUTH ONCE DAILY BEFORE BREAKFAST   indomethacin 25 MG capsule Commonly known as:  INDOCIN Take 1 capsule (25 mg total) by mouth 2 (two) times daily with a meal.   lisinopril 2.5 MG tablet Commonly known as:  PRINIVIL,ZESTRIL Take 1 tablet (2.5 mg total) by mouth daily.   metFORMIN 1000 MG tablet Commonly known as:  GLUCOPHAGE Take 1 tablet (1,000 mg total) by mouth 2 (two) times daily with a meal.          Objective:    BP (!) 139/96   Pulse 86   Temp 98.3 F (36.8 C) (Oral)   Ht 5' 5"  (1.651 m)  Wt 200 lb (90.7 kg)   BMI 33.28 kg/m   Wt Readings from Last 3 Encounters:  10/24/18 200 lb (90.7 kg)  06/20/18 186 lb 6.4 oz (84.6 kg)  03/19/18 199 lb (90.3 kg)    Physical Exam  Constitutional: He is oriented to person, place, and time. He appears well-developed and well-nourished. No distress.  Eyes: Pupils are equal, round, and reactive to light. Conjunctivae and EOM are normal. No scleral icterus.  Neck: Neck supple. No thyromegaly present.  Cardiovascular: Normal rate, regular rhythm, normal heart sounds and intact distal pulses.  No murmur heard. Pulmonary/Chest: Effort normal and breath sounds normal. No respiratory distress. He has no  wheezes.  Musculoskeletal: Normal range of motion. He exhibits no edema.  Lymphadenopathy:    He has no cervical adenopathy.  Neurological: He is alert and oriented to person, place, and time. Coordination normal.  Skin: Skin is warm and dry. No rash noted. He is not diaphoretic.  Psychiatric: He has a normal mood and affect. His behavior is normal.  Nursing note and vitals reviewed.   Results for orders placed or performed in visit on 10/22/18  Bayer DCA Hb A1c Waived  Result Value Ref Range   HB A1C (BAYER DCA - WAIVED) 6.3 <7.0 %  Thyroid Panel With TSH  Result Value Ref Range   TSH 1.810 0.450 - 4.500 uIU/mL   T4, Total 6.3 4.5 - 12.0 ug/dL   T3 Uptake Ratio 26 24 - 39 %   Free Thyroxine Index 1.6 1.2 - 4.9  Lipid panel  Result Value Ref Range   Cholesterol, Total 186 100 - 199 mg/dL   Triglycerides 87 0 - 149 mg/dL   HDL 44 >39 mg/dL   VLDL Cholesterol Cal 17 5 - 40 mg/dL   LDL Calculated 125 (H) 0 - 99 mg/dL   Chol/HDL Ratio 4.2 0.0 - 5.0 ratio  CMP14+EGFR  Result Value Ref Range   Glucose 180 (H) 65 - 99 mg/dL   BUN 15 6 - 24 mg/dL   Creatinine, Ser 0.93 0.76 - 1.27 mg/dL   GFR calc non Af Amer 91 >59 mL/min/1.73   GFR calc Af Amer 106 >59 mL/min/1.73   BUN/Creatinine Ratio 16 9 - 20   Sodium 142 134 - 144 mmol/L   Potassium 4.6 3.5 - 5.2 mmol/L   Chloride 102 96 - 106 mmol/L   CO2 22 20 - 29 mmol/L   Calcium 9.3 8.7 - 10.2 mg/dL   Total Protein 6.6 6.0 - 8.5 g/dL   Albumin 4.4 3.5 - 5.5 g/dL   Globulin, Total 2.2 1.5 - 4.5 g/dL   Albumin/Globulin Ratio 2.0 1.2 - 2.2   Bilirubin Total 0.5 0.0 - 1.2 mg/dL   Alkaline Phosphatase 63 39 - 117 IU/L   AST 15 0 - 40 IU/L   ALT 15 0 - 44 IU/L  Specimen Status  Result Value Ref Range   WBC WILL FOLLOW    RBC WILL FOLLOW    Hemoglobin WILL FOLLOW    Hematocrit WILL FOLLOW    MCV WILL FOLLOW    MCH WILL FOLLOW    MCHC WILL FOLLOW    RDW WILL FOLLOW    Platelets WILL FOLLOW    Neutrophils WILL FOLLOW    Lymphs  WILL FOLLOW    Monocytes WILL FOLLOW    Eos WILL FOLLOW    Basos WILL FOLLOW    Neutrophils Absolute WILL FOLLOW    Lymphocytes Absolute WILL FOLLOW  Monocytes Absolute WILL FOLLOW    EOS (ABSOLUTE) WILL FOLLOW    Basophils Absolute WILL FOLLOW    Immature Granulocytes WILL FOLLOW    Immature Grans (Abs) WILL FOLLOW       Assessment & Plan:   Problem List Items Addressed This Visit      Cardiovascular and Mediastinum   Hypertension associated with diabetes ([place])   Relevant Medications   Dulaglutide (TRULICITY) 5.73 UK/0.2RK SOPN   glimepiride (AMARYL) 4 MG tablet   metFORMIN (GLUCOPHAGE) 1000 MG tablet   lisinopril (PRINIVIL,ZESTRIL) 2.5 MG tablet   Other Relevant Orders   CBC with Differential/Platelet   CMP14+EGFR     Endocrine   Type 2 diabetes mellitus (HCC) - Primary   Relevant Medications   Dulaglutide (TRULICITY) 2.70 WC/3.7SE SOPN   glimepiride (AMARYL) 4 MG tablet   metFORMIN (GLUCOPHAGE) 1000 MG tablet   lisinopril (PRINIVIL,ZESTRIL) 2.5 MG tablet   Other Relevant Orders   CBC with Differential/Platelet   Bayer DCA Hb A1c Waived   CMP14+EGFR   Hyperlipidemia associated with type 2 diabetes mellitus (HCC)   Relevant Medications   Dulaglutide (TRULICITY) 8.31 DV/7.6HY SOPN   glimepiride (AMARYL) 4 MG tablet   metFORMIN (GLUCOPHAGE) 1000 MG tablet   lisinopril (PRINIVIL,ZESTRIL) 2.5 MG tablet   Other Relevant Orders   Lipid panel    Continue current medications of metformin and Trulicity and glimepiride, will continue to hold the statin because of his elevated blood sugars because of it and myalgias.  He is feeling a lot better since he has been off of it and his blood sugars are running better.  Follow up plan: Return in about 3 months (around 01/23/2019), or if symptoms worsen or fail to improve, for Diabetes and hypertension cholesterol.  Counseling provided for all of the vaccine components Orders Placed This Encounter  Procedures  . CBC with  Differential/Platelet  . Bayer DCA Hb A1c Waived  . CMP14+EGFR  . Lipid panel    Caryl Pina, MD [facility] 10/24/2018, 1:10 PM

## 2018-10-25 IMAGING — CR DG CHEST 2V
2 series · 2 of 2 positions shown · non-contrast
Comparison: None.

CLINICAL DATA: Elevated LFTs

EXAM:
CHEST - 2 VIEW

[w chest pa]
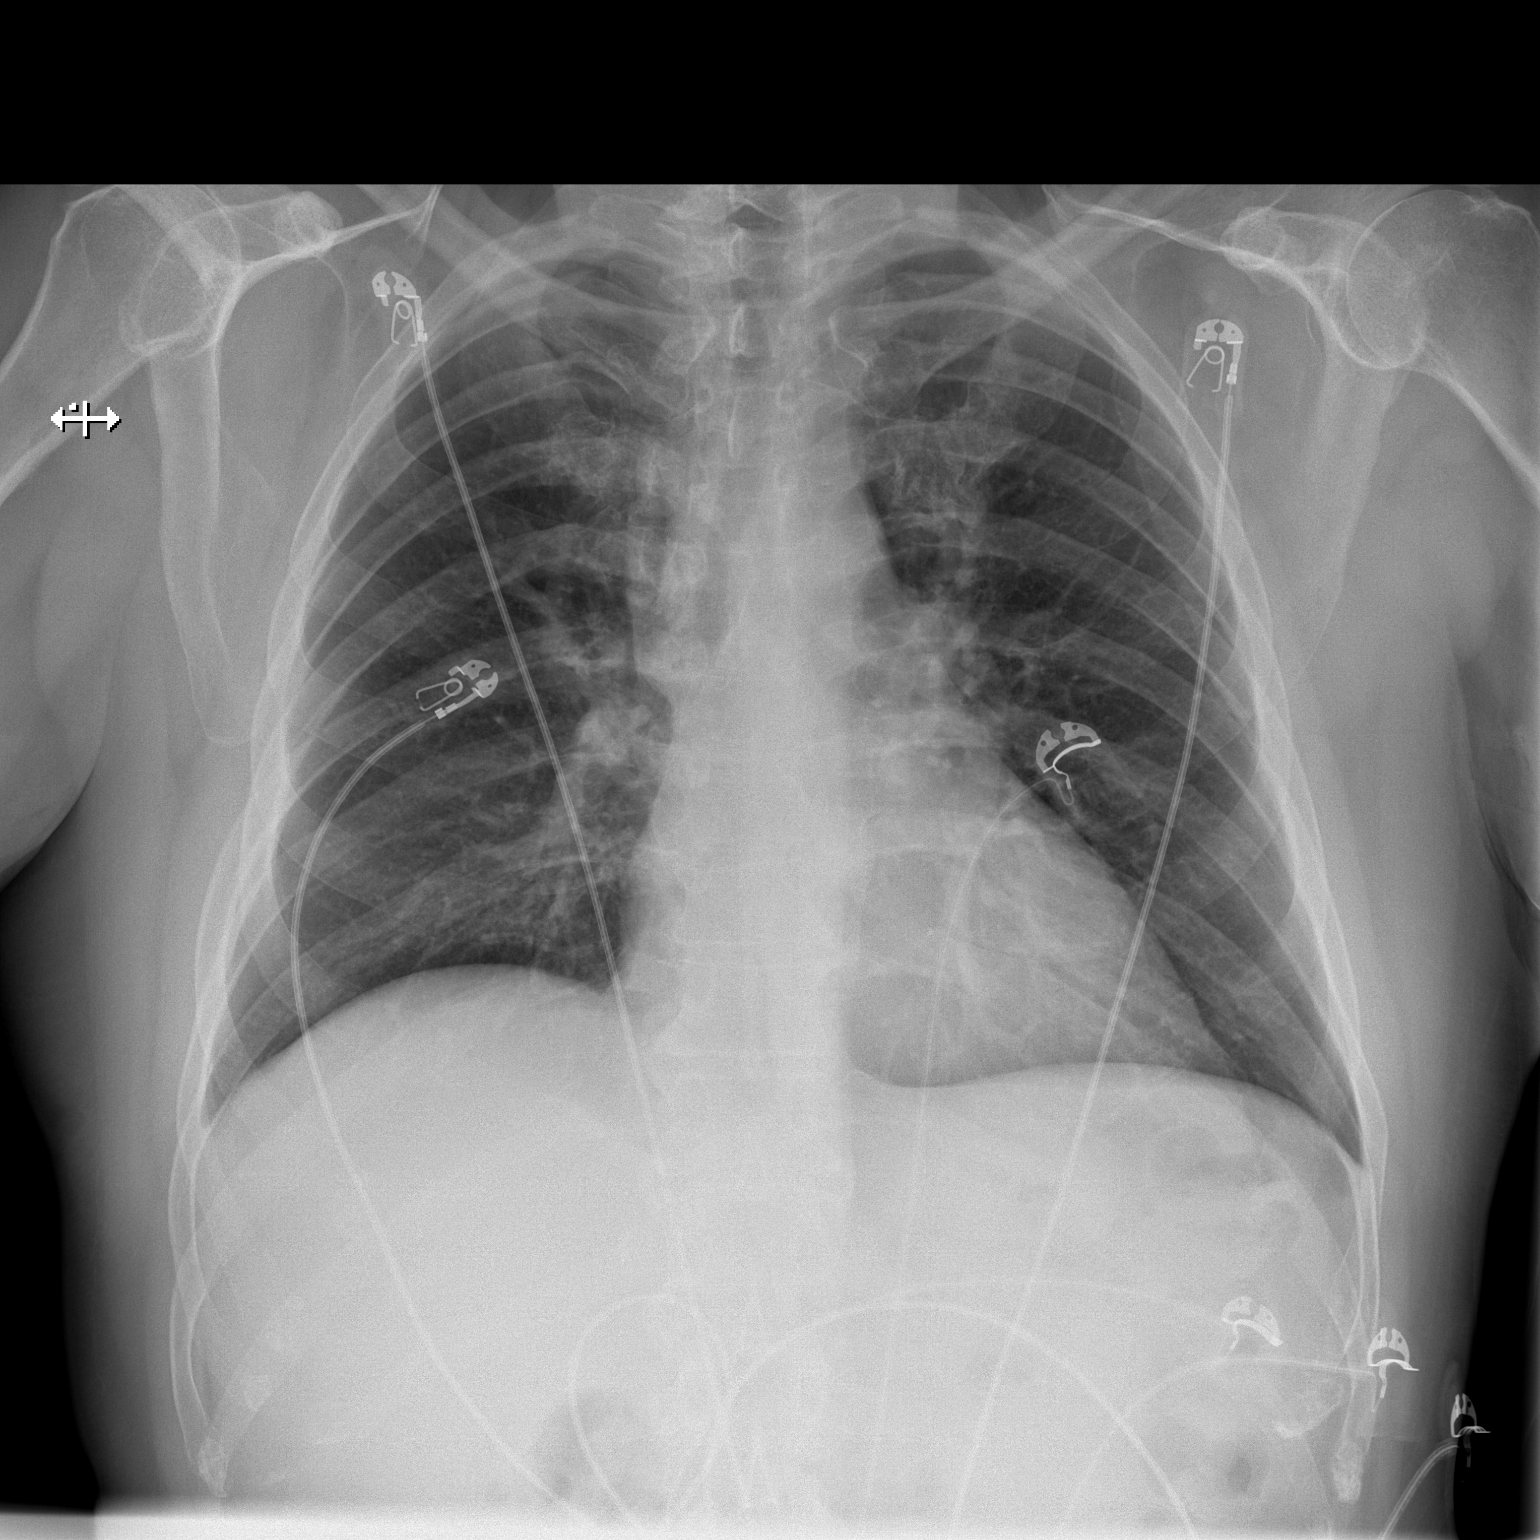

[w chest lat]
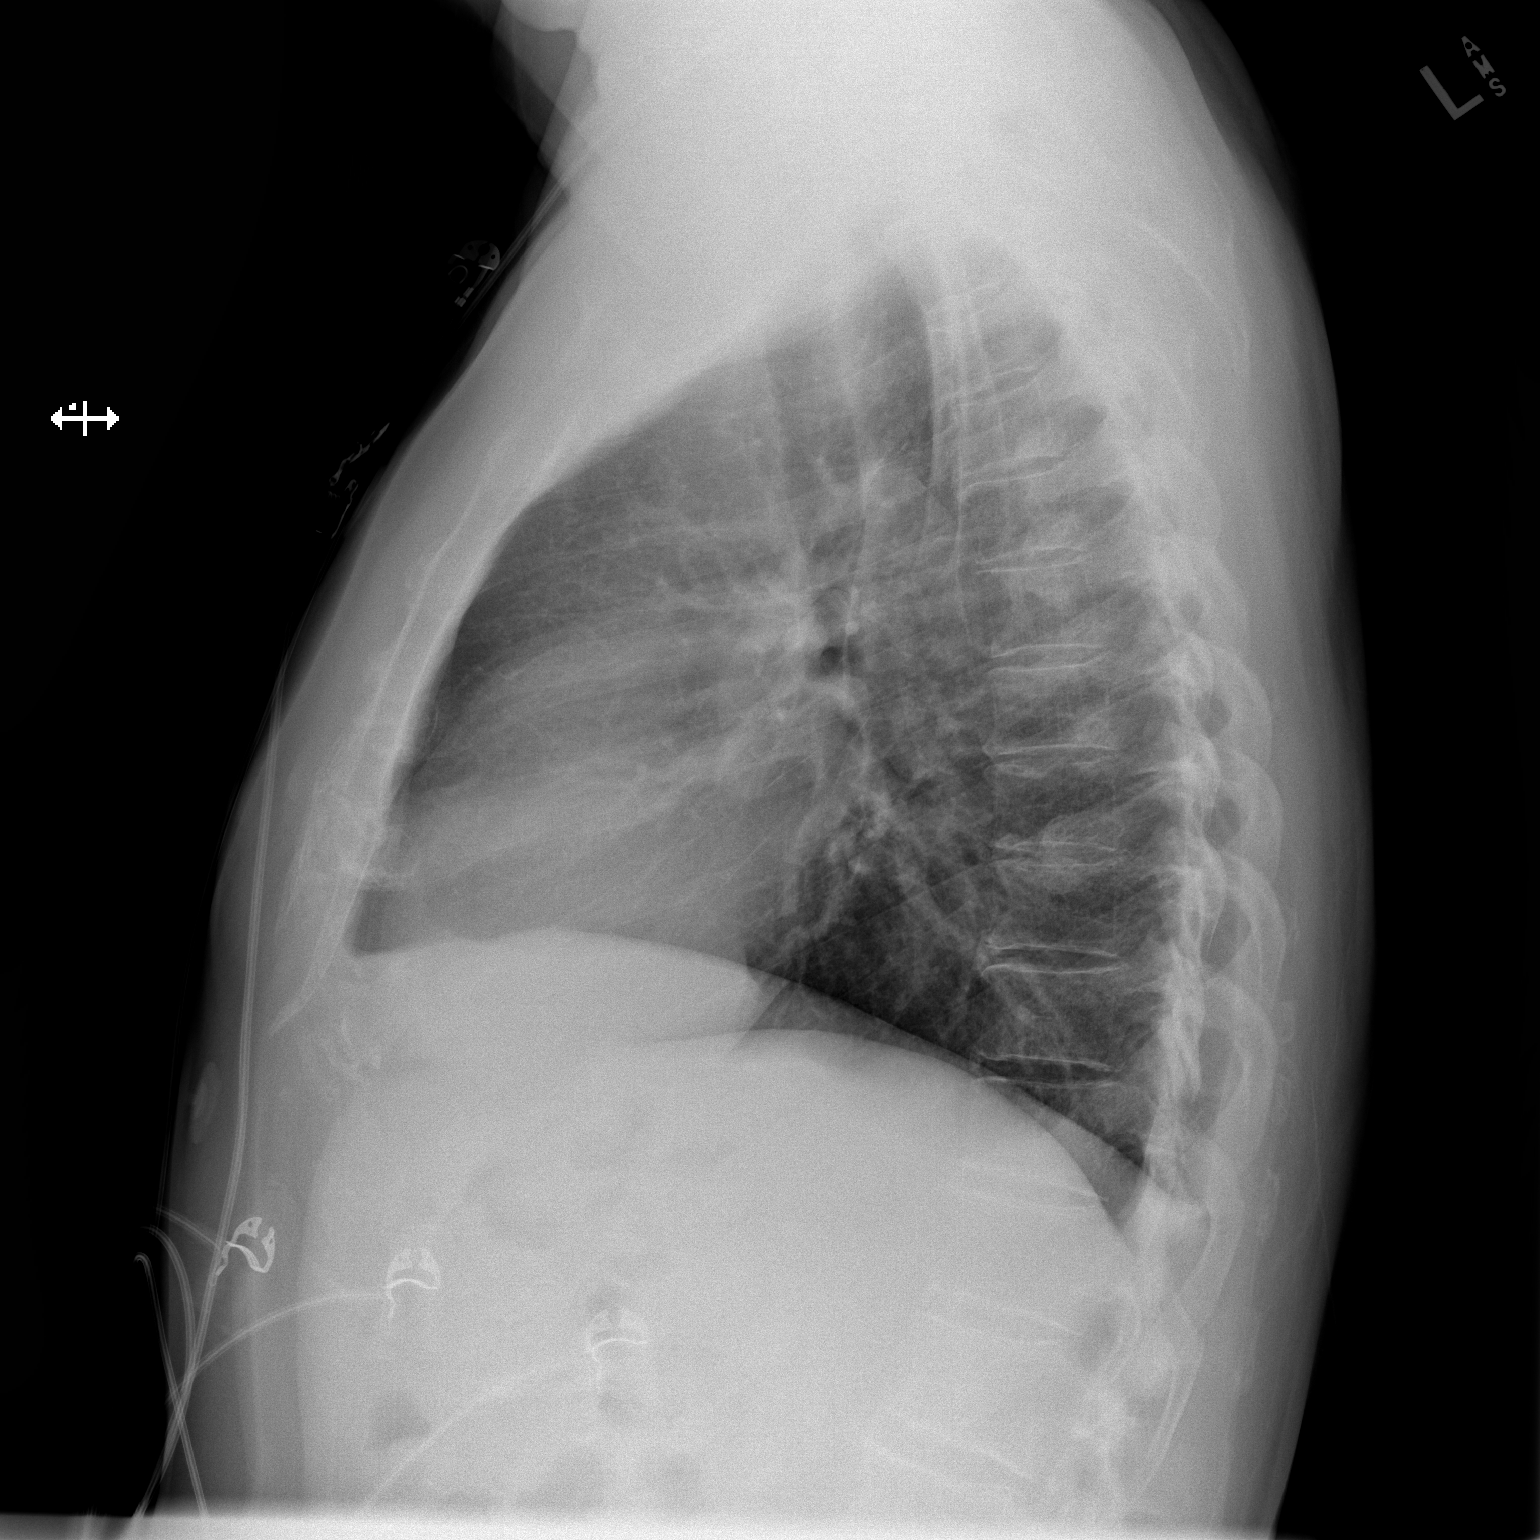

[2 of 2 positions shown; findings below may reference images not displayed]

FINDINGS: Lungs are clear.  No pleural effusion or pneumothorax.

The heart is normal in size.

Visualized osseous structures are within normal limits.
IMPRESSION: Normal chest radiographs.

## 2019-01-25 ENCOUNTER — Other Ambulatory Visit: Payer: Self-pay

## 2019-01-25 ENCOUNTER — Other Ambulatory Visit: Payer: 59

## 2019-01-25 DIAGNOSIS — E1169 Type 2 diabetes mellitus with other specified complication: Secondary | ICD-10-CM

## 2019-01-25 DIAGNOSIS — E785 Hyperlipidemia, unspecified: Principal | ICD-10-CM

## 2019-01-25 DIAGNOSIS — I10 Essential (primary) hypertension: Secondary | ICD-10-CM

## 2019-01-25 DIAGNOSIS — E1159 Type 2 diabetes mellitus with other circulatory complications: Secondary | ICD-10-CM

## 2019-01-25 DIAGNOSIS — I152 Hypertension secondary to endocrine disorders: Secondary | ICD-10-CM

## 2019-01-25 LAB — BAYER DCA HB A1C WAIVED: HB A1C (BAYER DCA - WAIVED): 6 % (ref <7.0)

## 2019-01-26 LAB — CBC WITH DIFFERENTIAL/PLATELET
Basophils Absolute: 0.1 10*3/uL (ref 0.0–0.2)
Basos: 1 %
EOS (ABSOLUTE): 0.6 10*3/uL — ABNORMAL HIGH (ref 0.0–0.4)
Eos: 7 %
Hematocrit: 44.8 % (ref 37.5–51.0)
Hemoglobin: 15.3 g/dL (ref 13.0–17.7)
Immature Grans (Abs): 0 10*3/uL (ref 0.0–0.1)
Immature Granulocytes: 0 %
LYMPHS ABS: 1.9 10*3/uL (ref 0.7–3.1)
Lymphs: 23 %
MCH: 29.5 pg (ref 26.6–33.0)
MCHC: 34.2 g/dL (ref 31.5–35.7)
MCV: 86 fL (ref 79–97)
Monocytes Absolute: 0.6 10*3/uL (ref 0.1–0.9)
Monocytes: 7 %
Neutrophils Absolute: 5.1 10*3/uL (ref 1.4–7.0)
Neutrophils: 62 %
Platelets: 238 10*3/uL (ref 150–450)
RBC: 5.19 x10E6/uL (ref 4.14–5.80)
RDW: 14.1 % (ref 11.6–15.4)
WBC: 8.2 10*3/uL (ref 3.4–10.8)

## 2019-01-26 LAB — CMP14+EGFR
ALT: 16 IU/L (ref 0–44)
AST: 17 IU/L (ref 0–40)
Albumin/Globulin Ratio: 2 (ref 1.2–2.2)
Albumin: 4.3 g/dL (ref 3.8–4.9)
Alkaline Phosphatase: 59 IU/L (ref 39–117)
BILIRUBIN TOTAL: 0.6 mg/dL (ref 0.0–1.2)
BUN/Creatinine Ratio: 15 (ref 9–20)
BUN: 14 mg/dL (ref 6–24)
CHLORIDE: 104 mmol/L (ref 96–106)
CO2: 24 mmol/L (ref 20–29)
Calcium: 9.4 mg/dL (ref 8.7–10.2)
Creatinine, Ser: 0.96 mg/dL (ref 0.76–1.27)
GFR calc Af Amer: 102 mL/min/{1.73_m2} (ref >59)
GFR calc non Af Amer: 88 mL/min/{1.73_m2} (ref >59)
Globulin, Total: 2.2 g/dL (ref 1.5–4.5)
Glucose: 143 mg/dL — ABNORMAL HIGH (ref 65–99)
Potassium: 4.5 mmol/L (ref 3.5–5.2)
Sodium: 144 mmol/L (ref 134–144)
Total Protein: 6.5 g/dL (ref 6.0–8.5)

## 2019-01-26 LAB — LIPID PANEL
Chol/HDL Ratio: 4.9 ratio (ref 0.0–5.0)
Cholesterol, Total: 182 mg/dL (ref 100–199)
HDL: 37 mg/dL — ABNORMAL LOW (ref >39)
LDL Calculated: 116 mg/dL — ABNORMAL HIGH (ref 0–99)
Triglycerides: 146 mg/dL (ref 0–149)
VLDL CHOLESTEROL CAL: 29 mg/dL (ref 5–40)

## 2019-01-31 ENCOUNTER — Encounter: Payer: Self-pay | Admitting: Family Medicine

## 2019-01-31 ENCOUNTER — Ambulatory Visit (INDEPENDENT_AMBULATORY_CARE_PROVIDER_SITE_OTHER): Payer: 59 | Admitting: Family Medicine

## 2019-01-31 ENCOUNTER — Other Ambulatory Visit: Payer: Self-pay

## 2019-01-31 VITALS — BP 134/89 | HR 77 | Temp 97.9°F | Ht 65.0 in | Wt 204.2 lb

## 2019-01-31 DIAGNOSIS — I10 Essential (primary) hypertension: Secondary | ICD-10-CM

## 2019-01-31 DIAGNOSIS — E1159 Type 2 diabetes mellitus with other circulatory complications: Secondary | ICD-10-CM | POA: Diagnosis not present

## 2019-01-31 DIAGNOSIS — E1169 Type 2 diabetes mellitus with other specified complication: Secondary | ICD-10-CM

## 2019-01-31 DIAGNOSIS — E785 Hyperlipidemia, unspecified: Secondary | ICD-10-CM

## 2019-01-31 MED ORDER — INDOMETHACIN 25 MG PO CAPS: 25.0000 mg | ORAL_CAPSULE | Freq: Two times a day (BID) | ORAL | 3 refills | Status: DC

## 2019-01-31 NOTE — Progress Notes (Signed)
BP 134/89   Pulse 77   Temp 97.9 F (36.6 C) (Oral)   Ht '5\' 5"'  (1.651 m)   Wt 204 lb 3.2 oz (92.6 kg)   BMI 33.98 kg/m    Subjective:    Patient ID: Antonio Fisher, male    DOB: Jan 04, 1961, 58 y.o.   MRN: [id number]  HPI: Antonio Fisher is a 58 y.o. male presenting on 01/31/2019 for Diabetes (3 month follow up)   HPI Type 2 diabetes mellitus Patient comes in today for recheck of his diabetes. Patient has been currently taking Trulicity and glimepiride and metformin. Patient is currently on an ACE inhibitor/ARB. Patient has not seen an ophthalmologist this year. Patient denies any issues with their feet.   Hypertension Patient is currently on lisinopril, and their blood pressure today is 134/89. Patient denies any lightheadedness or dizziness. Patient denies headaches, blurred vision, chest pains, shortness of breath, or weakness. Denies any side effects from medication and is content with current medication.   Hyperlipidemia Patient is coming in for recheck of his hyperlipidemia. The patient is currently taking no medication currently we are just monitoring. They deny any issues with myalgias or history of liver damage from it. They deny any focal numbness or weakness or chest pain.   Relevant past medical, surgical, family and social history reviewed and updated as indicated. Interim medical history since our last visit reviewed. Allergies and medications reviewed and updated.  Review of Systems  Constitutional: Negative for chills and fever.  Eyes: Negative for discharge.  Respiratory: Negative for shortness of breath and wheezing.   Cardiovascular: Negative for chest pain and leg swelling.  Musculoskeletal: Negative for back pain and gait problem.  Skin: Negative for rash.  Neurological: Negative for dizziness, weakness, light-headedness and numbness.  All other systems reviewed and are negative.   Per HPI unless specifically indicated above   Allergies as of  01/31/2019   No Known Allergies     Medication List       Accurate as of January 31, 2019  3:52 PM. Always use your most recent med list.        Dulaglutide 0.75 MG/0.5ML Sopn Commonly known as:  Trulicity Inject 5.62 mg into the skin once a week.   glimepiride 4 MG tablet Commonly known as:  AMARYL TAKE ONE TABLET BY MOUTH ONCE DAILY BEFORE BREAKFAST   indomethacin 25 MG capsule Commonly known as:  INDOCIN Take 1 capsule (25 mg total) by mouth 2 (two) times daily with a meal.   lisinopril 2.5 MG tablet Commonly known as:  PRINIVIL,ZESTRIL Take 1 tablet (2.5 mg total) by mouth daily.   metFORMIN 1000 MG tablet Commonly known as:  GLUCOPHAGE Take 1 tablet (1,000 mg total) by mouth 2 (two) times daily with a meal.          Objective:    BP 134/89   Pulse 77   Temp 97.9 F (36.6 C) (Oral)   Ht '5\' 5"'  (1.651 m)   Wt 204 lb 3.2 oz (92.6 kg)   BMI 33.98 kg/m   Wt Readings from Last 3 Encounters:  01/31/19 204 lb 3.2 oz (92.6 kg)  10/24/18 200 lb (90.7 kg)  06/20/18 186 lb 6.4 oz (84.6 kg)    Physical Exam Vitals signs and nursing note reviewed.  Constitutional:      General: He is not in acute distress.    Appearance: He is well-developed. He is not diaphoretic.  Eyes:     General:  No scleral icterus.    Conjunctiva/sclera: Conjunctivae normal.  Neck:     Musculoskeletal: Neck supple.     Thyroid: No thyromegaly.  Cardiovascular:     Rate and Rhythm: Normal rate and regular rhythm.     Heart sounds: Normal heart sounds. No murmur.  Pulmonary:     Effort: Pulmonary effort is normal. No respiratory distress.     Breath sounds: Normal breath sounds. No wheezing.  Musculoskeletal: Normal range of motion.  Lymphadenopathy:     Cervical: No cervical adenopathy.  Skin:    General: Skin is warm and dry.     Findings: No rash.  Neurological:     Mental Status: He is alert and oriented to person, place, and time.     Coordination: Coordination normal.   Psychiatric:        Behavior: Behavior normal.     Results for orders placed or performed in visit on 01/25/19  Lipid panel  Result Value Ref Range   Cholesterol, Total 182 100 - 199 mg/dL   Triglycerides 146 0 - 149 mg/dL   HDL 37 (L) >39 mg/dL   VLDL Cholesterol Cal 29 5 - 40 mg/dL   LDL Calculated 116 (H) 0 - 99 mg/dL   Chol/HDL Ratio 4.9 0.0 - 5.0 ratio  CMP14+EGFR  Result Value Ref Range   Glucose 143 (H) 65 - 99 mg/dL   BUN 14 6 - 24 mg/dL   Creatinine, Ser 0.96 0.76 - 1.27 mg/dL   GFR calc non Af Amer 88 >59 mL/min/1.73   GFR calc Af Amer 102 >59 mL/min/1.73   BUN/Creatinine Ratio 15 9 - 20   Sodium 144 134 - 144 mmol/L   Potassium 4.5 3.5 - 5.2 mmol/L   Chloride 104 96 - 106 mmol/L   CO2 24 20 - 29 mmol/L   Calcium 9.4 8.7 - 10.2 mg/dL   Total Protein 6.5 6.0 - 8.5 g/dL   Albumin 4.3 3.8 - 4.9 g/dL   Globulin, Total 2.2 1.5 - 4.5 g/dL   Albumin/Globulin Ratio 2.0 1.2 - 2.2   Bilirubin Total 0.6 0.0 - 1.2 mg/dL   Alkaline Phosphatase 59 39 - 117 IU/L   AST 17 0 - 40 IU/L   ALT 16 0 - 44 IU/L  Bayer DCA Hb A1c Waived  Result Value Ref Range   HB A1C (BAYER DCA - WAIVED) 6.0 <7.0 %  CBC with Differential/Platelet  Result Value Ref Range   WBC 8.2 3.4 - 10.8 x10E3/uL   RBC 5.19 4.14 - 5.80 x10E6/uL   Hemoglobin 15.3 13.0 - 17.7 g/dL   Hematocrit 44.8 37.5 - 51.0 %   MCV 86 79 - 97 fL   MCH 29.5 26.6 - 33.0 pg   MCHC 34.2 31.5 - 35.7 g/dL   RDW 14.1 11.6 - 15.4 %   Platelets 238 150 - 450 x10E3/uL   Neutrophils 62 Not Estab. %   Lymphs 23 Not Estab. %   Monocytes 7 Not Estab. %   Eos 7 Not Estab. %   Basos 1 Not Estab. %   Neutrophils Absolute 5.1 1.4 - 7.0 x10E3/uL   Lymphocytes Absolute 1.9 0.7 - 3.1 x10E3/uL   Monocytes Absolute 0.6 0.1 - 0.9 x10E3/uL   EOS (ABSOLUTE) 0.6 (H) 0.0 - 0.4 x10E3/uL   Basophils Absolute 0.1 0.0 - 0.2 x10E3/uL   Immature Granulocytes 0 Not Estab. %   Immature Grans (Abs) 0.0 0.0 - 0.1 x10E3/uL      Assessment & Plan:  Problem List Items Addressed This Visit      Cardiovascular and Mediastinum   Hypertension associated with diabetes ([place])     Endocrine   Type 2 diabetes mellitus ([place]) - Primary   Hyperlipidemia associated with type 2 diabetes mellitus ([place])      Continue current medication, A1c is 7.0 this week and the rest of his blood work looks pretty good, LDL is slightly high for diabetes but no changes for now, continue current medication and dietary and lifestyle modification. Follow up plan: Return in about 3 months (around 05/03/2019) for Diabetes hypertension.  Counseling provided for all of the vaccine components No orders of the defined types were placed in this encounter.   Caryl Pina, MD [facility] 01/31/2019, 3:52 PM

## 2019-05-06 ENCOUNTER — Other Ambulatory Visit: Payer: Self-pay

## 2019-05-06 ENCOUNTER — Ambulatory Visit (INDEPENDENT_AMBULATORY_CARE_PROVIDER_SITE_OTHER): Payer: 59 | Admitting: Family Medicine

## 2019-05-06 ENCOUNTER — Encounter: Payer: Self-pay | Admitting: Family Medicine

## 2019-05-06 DIAGNOSIS — E1159 Type 2 diabetes mellitus with other circulatory complications: Secondary | ICD-10-CM

## 2019-05-06 DIAGNOSIS — I10 Essential (primary) hypertension: Secondary | ICD-10-CM | POA: Diagnosis not present

## 2019-05-06 DIAGNOSIS — E785 Hyperlipidemia, unspecified: Secondary | ICD-10-CM

## 2019-05-06 DIAGNOSIS — E1169 Type 2 diabetes mellitus with other specified complication: Secondary | ICD-10-CM | POA: Diagnosis not present

## 2019-05-06 MED ORDER — LISINOPRIL 2.5 MG PO TABS: 2.5000 mg | ORAL_TABLET | Freq: Every day | ORAL | 3 refills | Status: DC

## 2019-05-06 MED ORDER — GLIMEPIRIDE 4 MG PO TABS: ORAL_TABLET | ORAL | 3 refills | Status: DC

## 2019-05-06 NOTE — Progress Notes (Signed)
Virtual Visit via telephone Note  I connected with Antonio Fisher on 05/06/19 at 1447 by telephone and verified that I am speaking with the correct person using two identifiers. Antonio Fisher is currently located at home and no other people are currently with her during visit. The provider, Elige RadonJoshua A Dettinger, MD is located in their office at time of visit.  Call ended at 1455  I discussed the limitations, risks, security and privacy concerns of performing an evaluation and management service by telephone and the availability of in person appointments. I also discussed with the patient that there may be a patient responsible charge related to this service. The patient expressed understanding and agreed to proceed.   History and Present Illness: Type 2 diabetes mellitus Patient comes in today for recheck of his diabetes. Patient has been currently taking trulicity and glimepiride and metformin and 116 bs. Patient is currently on an ACE inhibitor/ARB. Patient has not seen an ophthalmologist this year. Patient denies any issues with their feet.   Hypertension Patient is currently on lisinopril, and their blood pressure today is unknown. Patient denies any lightheadedness or dizziness. Patient denies headaches, blurred vision, chest pains, shortness of breath, or weakness. Denies any side effects from medication and is content with current medication.   Hyperlipidemia Patient is coming in for recheck of his hyperlipidemia. The patient is currently taking no medication currently. They deny any issues with myalgias or history of liver damage from it. They deny any focal numbness or weakness or chest pain.   No diagnosis found.  Outpatient Encounter Medications as of 05/06/2019  Medication Sig  . Dulaglutide (TRULICITY) 0.75 MG/0.5ML SOPN Inject 0.75 mg into the skin once a week.  Marland Kitchen. glimepiride (AMARYL) 4 MG tablet TAKE ONE TABLET BY MOUTH ONCE DAILY BEFORE BREAKFAST  . indomethacin  (INDOCIN) 25 MG capsule Take 1 capsule (25 mg total) by mouth 2 (two) times daily with a meal.  . lisinopril (PRINIVIL,ZESTRIL) 2.5 MG tablet Take 1 tablet (2.5 mg total) by mouth daily.  . metFORMIN (GLUCOPHAGE) 1000 MG tablet Take 1 tablet (1,000 mg total) by mouth 2 (two) times daily with a meal.   No facility-administered encounter medications on file as of 05/06/2019.     Review of Systems  Constitutional: Negative for chills and fever.  Eyes: Negative for visual disturbance.  Respiratory: Negative for shortness of breath and wheezing.   Cardiovascular: Negative for chest pain and leg swelling.  Musculoskeletal: Negative for back pain and gait problem.  Skin: Negative for rash.  Neurological: Negative for dizziness, weakness and light-headedness.  All other systems reviewed and are negative.   Observations/Objective: Patient sounds comfortable and in no acute distress  Assessment and Plan: Problem List Items Addressed This Visit      Cardiovascular and Mediastinum   Hypertension associated with diabetes (HCC)   Relevant Medications   glimepiride (AMARYL) 4 MG tablet   lisinopril (ZESTRIL) 2.5 MG tablet     Endocrine   Type 2 diabetes mellitus (HCC) - Primary   Relevant Medications   glimepiride (AMARYL) 4 MG tablet   lisinopril (ZESTRIL) 2.5 MG tablet   Hyperlipidemia associated with type 2 diabetes mellitus (HCC)   Relevant Medications   glimepiride (AMARYL) 4 MG tablet   lisinopril (ZESTRIL) 2.5 MG tablet       Follow Up Instructions: 3 months for recheck of diabetes  Patient has been stable at for quite some time on his current diabetic regiment including Trulicity metformin and  glimepiride and he takes lisinopril for renal protection.  Patient is disinclined to take a statin we will try again next time.   I discussed the assessment and treatment plan with the patient. The patient was provided an opportunity to ask questions and all were answered. The patient  agreed with the plan and demonstrated an understanding of the instructions.   The patient was advised to call back or seek an in-person evaluation if the symptoms worsen or if the condition fails to improve as anticipated.  The above assessment and management plan was discussed with the patient. The patient verbalized understanding of and has agreed to the management plan. Patient is aware to call the clinic if symptoms persist or worsen. Patient is aware when to return to the clinic for a follow-up visit. Patient educated on when it is appropriate to go to the emergency department.    I provided 8 minutes of non-face-to-face time during this encounter.    Worthy Rancher, MD

## 2019-06-17 IMAGING — US US ABDOMEN LIMITED
1 series · 14 of 25 positions shown · non-contrast
Comparison: None.

CLINICAL DATA: Elevated ALT.

EXAM:
ULTRASOUND ABDOMEN LIMITED RIGHT UPPER QUADRANT

[Series 1: us abdomen limited · 0.19mm/px · 14 of 61 slices shown]
[im 1/61]
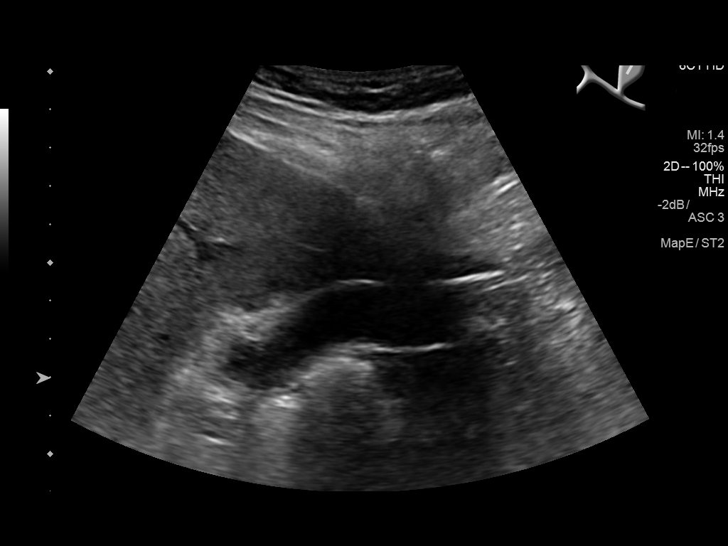
[im 6/61]
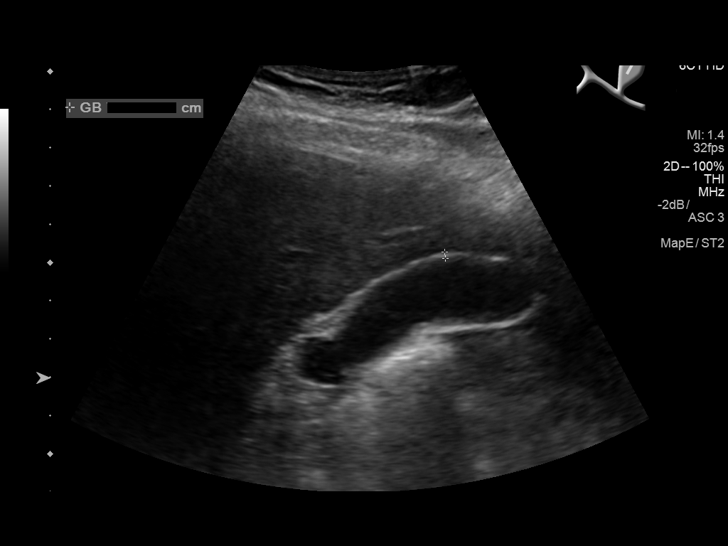
[im 11/61]
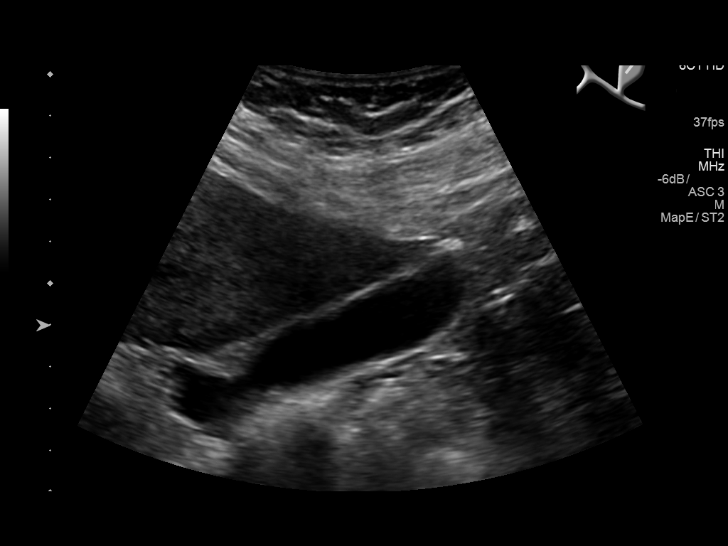
[im 16/61]
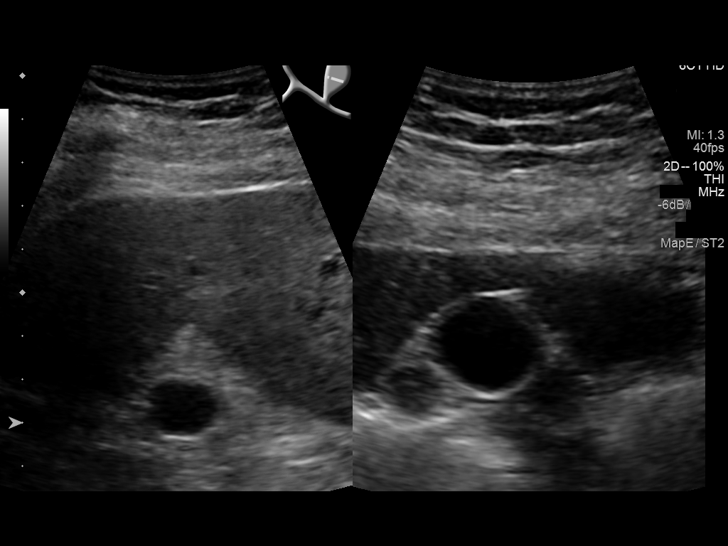
[im 21/61]
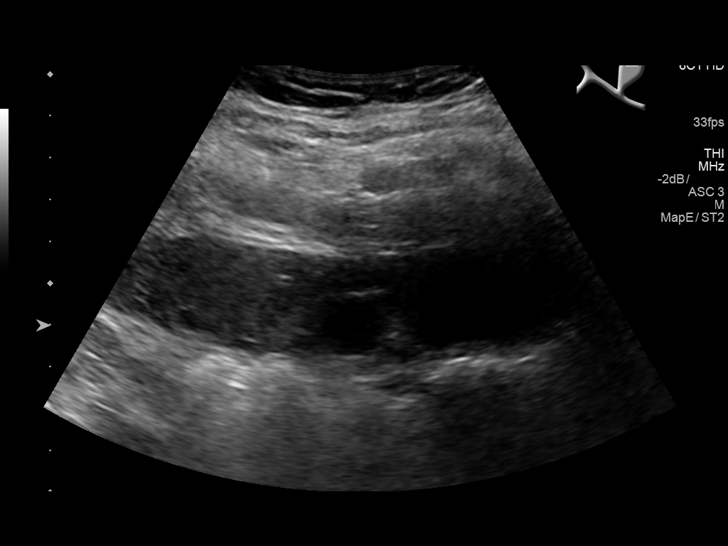
[im 23/61]
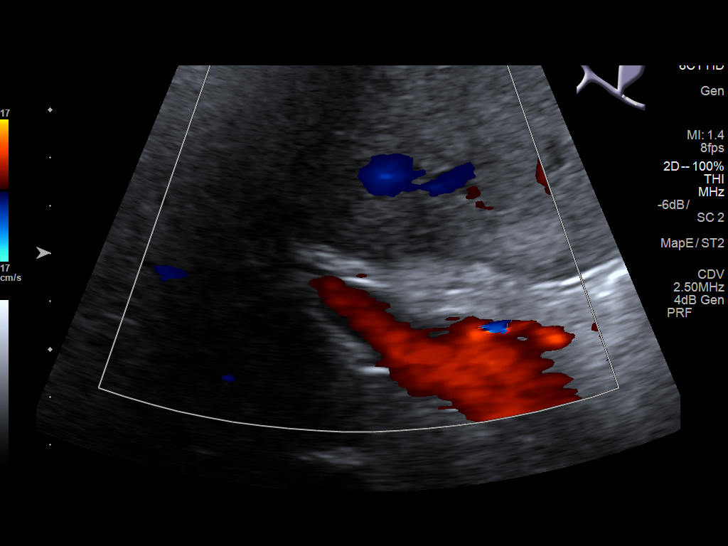
[im 28/61]
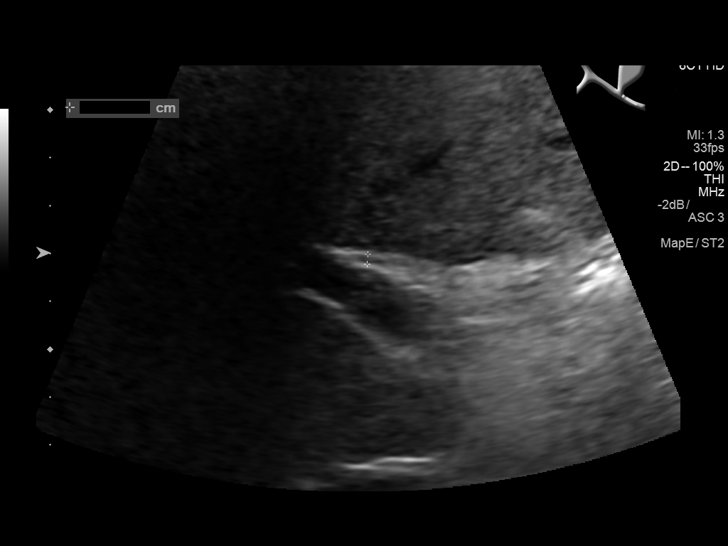
[im 33/61]
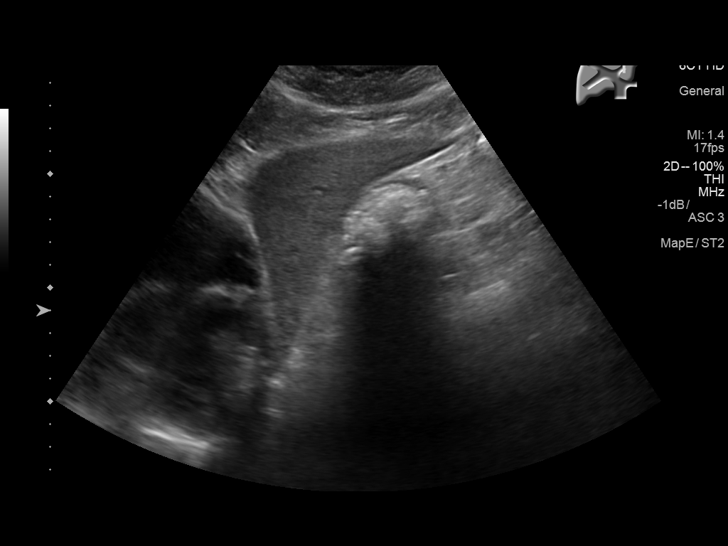
[im 38/61]
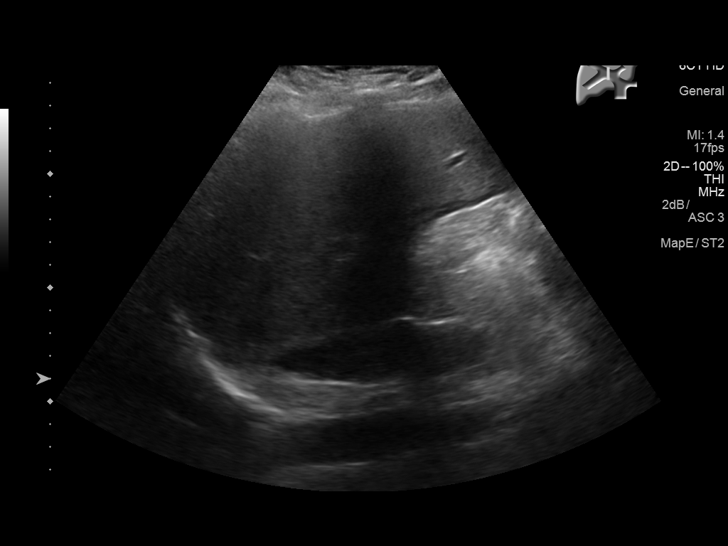
[im 41/61]
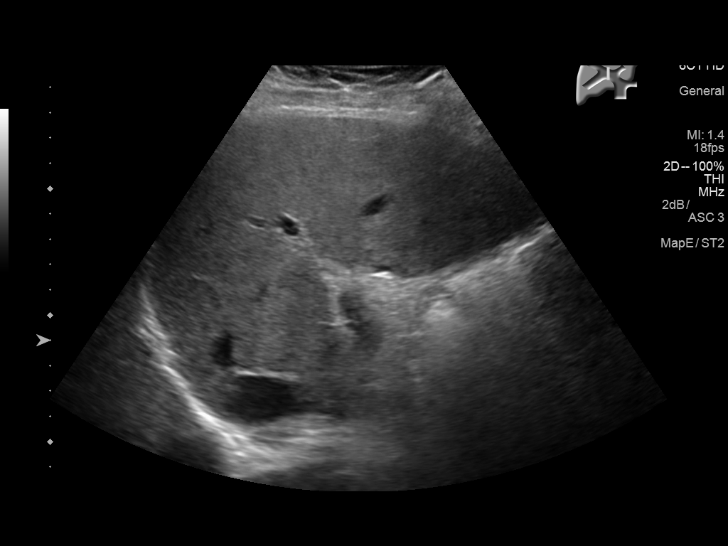
[im 46/61]
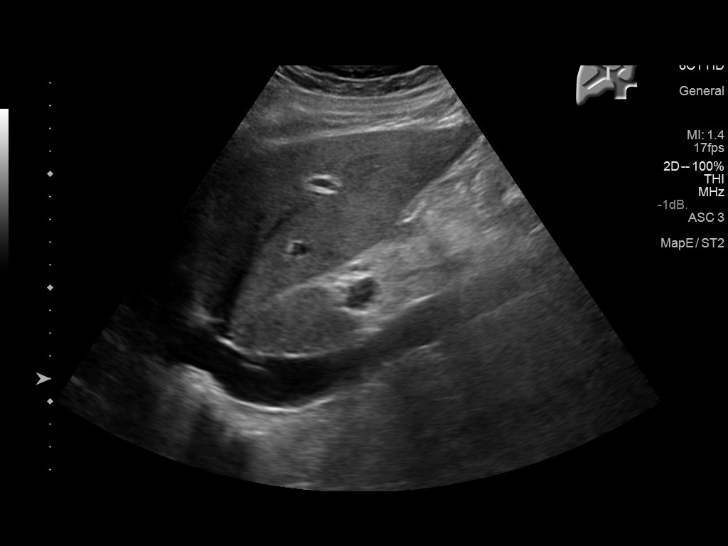
[im 51/61]
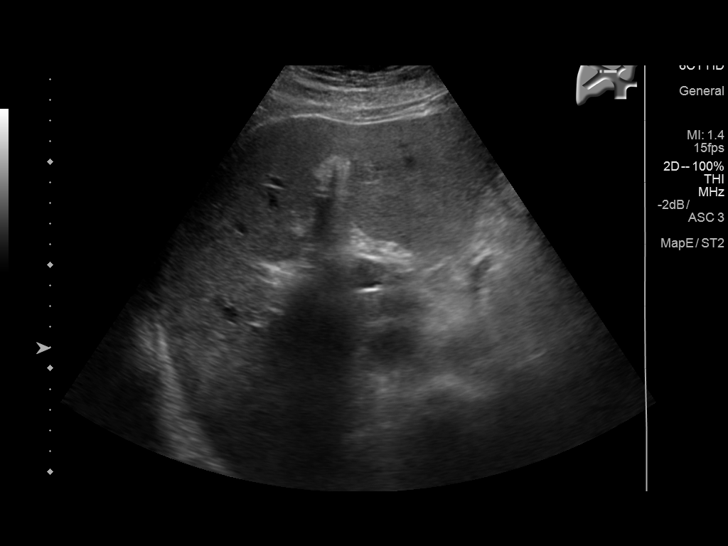
[im 56/61]
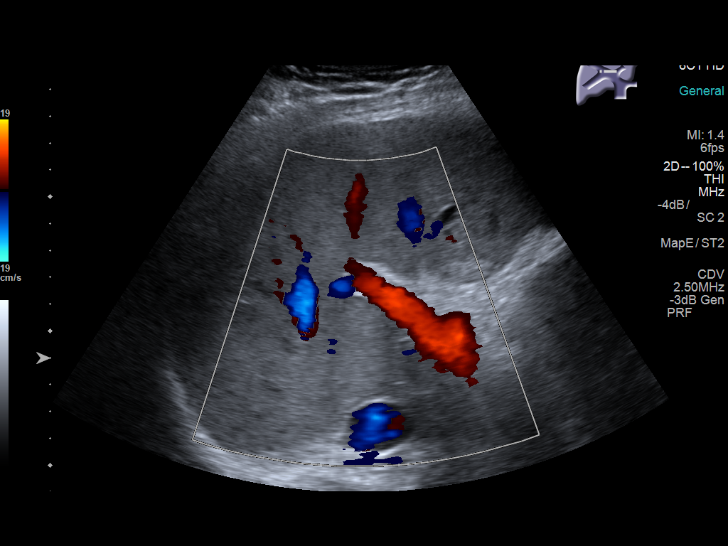
[im 61/61]
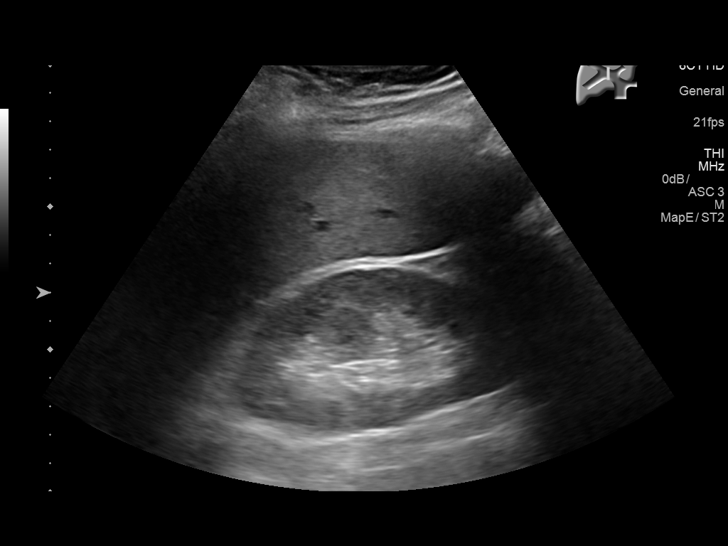

[14 of 25 positions shown; findings below may reference images not displayed]

FINDINGS: Gallbladder:

Gallbladder has a normal appearance. Gallbladder wall is
millimeters, within normal limits. No stones or pericholecystic
fluid. No sonographic Murphy's sign.

Common bile duct:

Diameter: 2.4 millimeters

Liver:

No focal lesion identified. Within normal limits in parenchymal
echogenicity. Portal vein is patent on color Doppler imaging with
normal direction of blood flow towards the liver.
IMPRESSION: Normal evaluation the RIGHT UPPER QUADRANT.

## 2019-10-31 ENCOUNTER — Other Ambulatory Visit: Payer: Self-pay | Admitting: Family Medicine

## 2019-10-31 DIAGNOSIS — E1169 Type 2 diabetes mellitus with other specified complication: Secondary | ICD-10-CM

## 2019-11-01 ENCOUNTER — Other Ambulatory Visit: Payer: Self-pay | Admitting: Family Medicine

## 2019-11-01 DIAGNOSIS — I152 Hypertension secondary to endocrine disorders: Secondary | ICD-10-CM

## 2019-11-01 DIAGNOSIS — E1169 Type 2 diabetes mellitus with other specified complication: Secondary | ICD-10-CM

## 2019-11-01 DIAGNOSIS — E1159 Type 2 diabetes mellitus with other circulatory complications: Secondary | ICD-10-CM

## 2019-11-01 MED ORDER — GLIMEPIRIDE 4 MG PO TABS: ORAL_TABLET | ORAL | 3 refills | Status: DC

## 2019-11-01 MED ORDER — LISINOPRIL 2.5 MG PO TABS: 2.5000 mg | ORAL_TABLET | Freq: Every day | ORAL | 3 refills | Status: DC

## 2019-11-01 NOTE — Telephone Encounter (Signed)
All requested - sent today

## 2019-11-16 ENCOUNTER — Other Ambulatory Visit: Payer: Self-pay | Admitting: Family Medicine

## 2019-11-16 DIAGNOSIS — E1169 Type 2 diabetes mellitus with other specified complication: Secondary | ICD-10-CM

## 2019-11-20 ENCOUNTER — Telehealth: Payer: Self-pay | Admitting: *Deleted

## 2019-11-20 NOTE — Telephone Encounter (Signed)
Prior Auth for Trulicity 0.75mg /0.5ML-In Process   Key: [id number] -   PA Case ID: [id number]   [facility] is reviewing your PA request. Typically an electronic response will be received within 72 hours. To check for an update later, open this request from your dashboard.  You may close this dialog and return to your dashboard to perform other tasks.

## 2019-11-25 NOTE — Telephone Encounter (Signed)
Approved called into [facility]

## 2019-12-06 ENCOUNTER — Ambulatory Visit (INDEPENDENT_AMBULATORY_CARE_PROVIDER_SITE_OTHER): Payer: 59 | Admitting: Family Medicine

## 2019-12-06 ENCOUNTER — Encounter: Payer: Self-pay | Admitting: Family Medicine

## 2019-12-06 DIAGNOSIS — E785 Hyperlipidemia, unspecified: Secondary | ICD-10-CM

## 2019-12-06 DIAGNOSIS — E1169 Type 2 diabetes mellitus with other specified complication: Secondary | ICD-10-CM | POA: Diagnosis not present

## 2019-12-06 DIAGNOSIS — I10 Essential (primary) hypertension: Secondary | ICD-10-CM | POA: Diagnosis not present

## 2019-12-06 DIAGNOSIS — E1159 Type 2 diabetes mellitus with other circulatory complications: Secondary | ICD-10-CM

## 2019-12-06 MED ORDER — TRULICITY 0.75 MG/0.5ML ~~LOC~~ SOAJ: 0.7500 mg | SUBCUTANEOUS | 3 refills | Status: DC

## 2019-12-06 MED ORDER — METFORMIN HCL 1000 MG PO TABS: 1000.0000 mg | ORAL_TABLET | Freq: Two times a day (BID) | ORAL | 3 refills | Status: DC

## 2019-12-06 MED ORDER — GLIMEPIRIDE 4 MG PO TABS: ORAL_TABLET | ORAL | 3 refills | Status: DC

## 2019-12-06 MED ORDER — LISINOPRIL 2.5 MG PO TABS: 2.5000 mg | ORAL_TABLET | Freq: Every day | ORAL | 3 refills | Status: DC

## 2019-12-06 NOTE — Progress Notes (Signed)
Virtual Visit via telephone Note  I connected with Antonio Fisher on 12/06/19 at 1115 by telephone and verified that I am speaking with the correct person using two identifiers. Antonio Fisher is currently located at home and no other people are currently with her during visit. The provider, Fransisca Kaufmann , MD is located in their office at time of visit.  Call ended at 1128  I discussed the limitations, risks, security and privacy concerns of performing an evaluation and management service by telephone and the availability of in person appointments. I also discussed with the patient that there may be a patient responsible charge related to this service. The patient expressed understanding and agreed to proceed.   History and Present Illness: Type 2 diabetes mellitus Patient comes in today for recheck of his diabetes. Patient has been currently taking metformin and glimepiride and trulicity. Patient is currently on an ACE inhibitor/ARB. Patient has not seen an ophthalmologist this year. Patient denies any issues with their feet.   Hypertension Patient is currently on lisinopril, and their blood pressure today is unknown. Patient denies any lightheadedness or dizziness. Patient denies headaches, blurred vision, chest pains, shortness of breath, or weakness. Denies any side effects from medication and is content with current medication.   Hyperlipidemia Patient is coming in for recheck of his hyperlipidemia. The patient is currently taking no medication currently and is current diet. They deny any issues with myalgias or history of liver damage from it. They deny any focal numbness or weakness or chest pain.   1. Type 2 diabetes mellitus with other specified complication, without long-term current use of insulin ([place])   2. Hypertension associated with diabetes ([place])   3. Hyperlipidemia associated with type 2 diabetes mellitus [facility])     Outpatient Encounter Medications as of 12/06/2019   Medication Sig  . Dulaglutide (TRULICITY) 4.68 EH/2.1YY SOPN Inject 0.75 mg into the skin once a week.  Marland Kitchen glimepiride (AMARYL) 4 MG tablet TAKE ONE TABLET BY MOUTH ONCE DAILY BEFORE BREAKFAST  . indomethacin (INDOCIN) 25 MG capsule Take 1 capsule (25 mg total) by mouth 2 (two) times daily with a meal.  . lisinopril (ZESTRIL) 2.5 MG tablet Take 1 tablet (2.5 mg total) by mouth daily.  . metFORMIN (GLUCOPHAGE) 1000 MG tablet Take 1 tablet (1,000 mg total) by mouth 2 (two) times daily with a meal. Must be seen for any further refills  . [DISCONTINUED] glimepiride (AMARYL) 4 MG tablet TAKE ONE TABLET BY MOUTH ONCE DAILY BEFORE BREAKFAST  . [DISCONTINUED] lisinopril (ZESTRIL) 2.5 MG tablet Take 1 tablet (2.5 mg total) by mouth daily.  . [DISCONTINUED] metFORMIN (GLUCOPHAGE) 1000 MG tablet Take 1 tablet (1,000 mg total) by mouth 2 (two) times daily with a meal. Must be seen for any further refills  . [DISCONTINUED] TRULICITY 4.82 NO/0.3BC SOPN INJECT 0.75 MG SUBQ ONCE WEEKLY   No facility-administered encounter medications on file as of 12/06/2019.    Review of Systems  Constitutional: Negative for chills and fever.  Eyes: Negative for visual disturbance.  Respiratory: Negative for shortness of breath and wheezing.   Cardiovascular: Negative for chest pain and leg swelling.  Musculoskeletal: Negative for back pain and gait problem.  Skin: Negative for rash.  Neurological: Negative for dizziness, weakness and light-headedness.  Psychiatric/Behavioral: Negative for dysphoric mood, self-injury, sleep disturbance and suicidal ideas. The patient is not nervous/anxious.   All other systems reviewed and are negative.   Observations/Objective: Patient is comfortable and in no acute distress  Assessment and Plan: Problem List Items Addressed This Visit      Cardiovascular and Mediastinum   Hypertension associated with diabetes ([place])   Relevant Medications   glimepiride (AMARYL) 4 MG tablet    lisinopril (ZESTRIL) 2.5 MG tablet   metFORMIN (GLUCOPHAGE) 1000 MG tablet   Dulaglutide (TRULICITY) 7.19 LV/7.4XV SOPN   Other Relevant Orders   CMP14+EGFR     Endocrine   Type 2 diabetes mellitus (HCC) - Primary   Relevant Medications   glimepiride (AMARYL) 4 MG tablet   lisinopril (ZESTRIL) 2.5 MG tablet   metFORMIN (GLUCOPHAGE) 1000 MG tablet   Dulaglutide (TRULICITY) 8.55 MZ/5.8EW SOPN   Other Relevant Orders   Bayer DCA Hb A1c Waived   CBC with Differential/Platelet   Hyperlipidemia associated with type 2 diabetes mellitus (HCC)   Relevant Medications   glimepiride (AMARYL) 4 MG tablet   lisinopril (ZESTRIL) 2.5 MG tablet   metFORMIN (GLUCOPHAGE) 1000 MG tablet   Dulaglutide (TRULICITY) 2.57 KV/3.5LE SOPN   Other Relevant Orders   Lipid panel     continue current medication and will see in 3 months.  Follow up plan: Return in about 3 months (around 03/05/2020), or if symptoms worsen or fail to improve, for htn and diabetes.     I discussed the assessment and treatment plan with the patient. The patient was provided an opportunity to ask questions and all were answered. The patient agreed with the plan and demonstrated an understanding of the instructions.   The patient was advised to call back or seek an in-person evaluation if the symptoms worsen or if the condition fails to improve as anticipated.  The above assessment and management plan was discussed with the patient. The patient verbalized understanding of and has agreed to the management plan. Patient is aware to call the clinic if symptoms persist or worsen. Patient is aware when to return to the clinic for a follow-up visit. Patient educated on when it is appropriate to go to the emergency department.    I provided 13 minutes of non-face-to-face time during this encounter.    Worthy Rancher, MD

## 2020-03-05 ENCOUNTER — Encounter: Payer: Self-pay | Admitting: Family Medicine

## 2020-03-05 ENCOUNTER — Other Ambulatory Visit: Payer: Self-pay

## 2020-03-05 ENCOUNTER — Ambulatory Visit (INDEPENDENT_AMBULATORY_CARE_PROVIDER_SITE_OTHER): Payer: 59 | Admitting: Family Medicine

## 2020-03-05 VITALS — BP 128/76 | HR 84 | Temp 97.7°F | Ht 65.0 in | Wt 195.5 lb

## 2020-03-05 DIAGNOSIS — I10 Essential (primary) hypertension: Secondary | ICD-10-CM | POA: Diagnosis not present

## 2020-03-05 DIAGNOSIS — E1159 Type 2 diabetes mellitus with other circulatory complications: Secondary | ICD-10-CM | POA: Diagnosis not present

## 2020-03-05 DIAGNOSIS — E1169 Type 2 diabetes mellitus with other specified complication: Secondary | ICD-10-CM

## 2020-03-05 DIAGNOSIS — E785 Hyperlipidemia, unspecified: Secondary | ICD-10-CM

## 2020-03-05 LAB — BAYER DCA HB A1C WAIVED: HB A1C (BAYER DCA - WAIVED): 6.1 % (ref <7.0)

## 2020-03-05 MED ORDER — LISINOPRIL 2.5 MG PO TABS: 2.5000 mg | ORAL_TABLET | Freq: Every day | ORAL | 3 refills | Status: DC

## 2020-03-05 MED ORDER — TRULICITY 0.75 MG/0.5ML ~~LOC~~ SOAJ: 0.7500 mg | SUBCUTANEOUS | 3 refills | Status: DC

## 2020-03-05 MED ORDER — GLIMEPIRIDE 4 MG PO TABS: ORAL_TABLET | ORAL | 3 refills | Status: DC

## 2020-03-05 MED ORDER — METFORMIN HCL 1000 MG PO TABS: 1000.0000 mg | ORAL_TABLET | Freq: Two times a day (BID) | ORAL | 3 refills | Status: DC

## 2020-03-05 NOTE — Progress Notes (Signed)
BP 128/76   Pulse 84   Temp 97.7 F (36.5 C) (Temporal)   Ht '5\' 5"'  (1.651 m)   Wt 195 lb 8 oz (88.7 kg)   BMI 32.53 kg/m    Subjective:   Patient ID: Antonio Fisher, male    DOB: January 06, 1961, 59 y.o.   MRN: [id number]  HPI: Antonio Fisher is a 59 y.o. male presenting on 03/05/2020 for Medical Management of Chronic Issues   HPI Type 2 diabetes mellitus Patient comes in today for recheck of his diabetes. Patient has been currently taking Trulicity and glimepiride and Metformin, A1c today is 6.1. Patient is currently on an ACE inhibitor/ARB. Patient has not seen an ophthalmologist this year. Patient denies any issues with their feet.   Hypertension Patient is currently on lisinopril, and their blood pressure today is 128/76. Patient denies any lightheadedness or dizziness. Patient denies headaches, blurred vision, chest pains, shortness of breath, or weakness. Denies any side effects from medication and is content with current medication.   Hyperlipidemia Patient is coming in for recheck of his hyperlipidemia. The patient is currently taking no medication currently and declines statin. They deny any issues with myalgias or history of liver damage from it. They deny any focal numbness or weakness or chest pain.   Relevant past medical, surgical, family and social history reviewed and updated as indicated. Interim medical history since our last visit reviewed. Allergies and medications reviewed and updated.  Review of Systems  Constitutional: Negative for chills and fever.  Eyes: Negative for visual disturbance.  Respiratory: Negative for shortness of breath and wheezing.   Cardiovascular: Negative for chest pain and leg swelling.  Musculoskeletal: Negative for back pain and gait problem.  Skin: Negative for rash.  Neurological: Negative for dizziness, weakness and numbness.  All other systems reviewed and are negative.   Per HPI unless specifically indicated  above   Allergies as of 03/05/2020   No Known Allergies     Medication List       Accurate as of March 05, 2020  4:00 PM. If you have any questions, ask your nurse or doctor.        glimepiride 4 MG tablet Commonly known as: AMARYL TAKE ONE TABLET BY MOUTH ONCE DAILY BEFORE BREAKFAST   indomethacin 25 MG capsule Commonly known as: INDOCIN Take 1 capsule (25 mg total) by mouth 2 (two) times daily with a meal.   lisinopril 2.5 MG tablet Commonly known as: ZESTRIL Take 1 tablet (2.5 mg total) by mouth daily.   metFORMIN 1000 MG tablet Commonly known as: GLUCOPHAGE Take 1 tablet (1,000 mg total) by mouth 2 (two) times daily with a meal. Must be seen for any further refills   Trulicity 0.37 CW/8.8QB Sopn Generic drug: Dulaglutide Inject 0.75 mg into the skin once a week.        Objective:   BP 128/76   Pulse 84   Temp 97.7 F (36.5 C) (Temporal)   Ht '5\' 5"'  (1.651 m)   Wt 195 lb 8 oz (88.7 kg)   BMI 32.53 kg/m   Wt Readings from Last 3 Encounters:  03/05/20 195 lb 8 oz (88.7 kg)  01/31/19 204 lb 3.2 oz (92.6 kg)  10/24/18 200 lb (90.7 kg)    Physical Exam Vitals and nursing note reviewed.  Constitutional:      General: He is not in acute distress.    Appearance: He is well-developed. He is not diaphoretic.  Eyes:  General: No scleral icterus.    Conjunctiva/sclera: Conjunctivae normal.  Neck:     Thyroid: No thyromegaly.  Cardiovascular:     Rate and Rhythm: Normal rate and regular rhythm.     Heart sounds: Normal heart sounds. No murmur.  Pulmonary:     Effort: Pulmonary effort is normal. No respiratory distress.     Breath sounds: Normal breath sounds. No wheezing.  Musculoskeletal:        General: Normal range of motion.     Cervical back: Neck supple.  Lymphadenopathy:     Cervical: No cervical adenopathy.  Skin:    General: Skin is warm and dry.     Findings: No rash.  Neurological:     Mental Status: He is alert and oriented to person,  place, and time.     Coordination: Coordination normal.  Psychiatric:        Behavior: Behavior normal.       Assessment & Plan:   Problem List Items Addressed This Visit      Cardiovascular and Mediastinum   Hypertension associated with diabetes (HCC)   Relevant Medications   Dulaglutide (TRULICITY) 3.49 ZP/9.1TA SOPN   glimepiride (AMARYL) 4 MG tablet   lisinopril (ZESTRIL) 2.5 MG tablet   metFORMIN (GLUCOPHAGE) 1000 MG tablet   Other Relevant Orders   CMP14+EGFR     Endocrine   Type 2 diabetes mellitus (HCC) - Primary   Relevant Medications   Dulaglutide (TRULICITY) 5.69 VX/4.8AX SOPN   glimepiride (AMARYL) 4 MG tablet   lisinopril (ZESTRIL) 2.5 MG tablet   metFORMIN (GLUCOPHAGE) 1000 MG tablet   Other Relevant Orders   CBC with Differential/Platelet   CMP14+EGFR   Bayer DCA Hb A1c Waived   Hyperlipidemia associated with type 2 diabetes mellitus (HCC)   Relevant Medications   Dulaglutide (TRULICITY) 6.55 VZ/4.8OL SOPN   glimepiride (AMARYL) 4 MG tablet   lisinopril (ZESTRIL) 2.5 MG tablet   metFORMIN (GLUCOPHAGE) 1000 MG tablet   Other Relevant Orders   Lipid panel      Continue current medication, A1c 6.1 and blood pressure looks good today, no changes. Follow up plan: Return in about 6 months (around 09/04/2020), or if symptoms worsen or fail to improve, for Diabetes and hypertension recheck.  Counseling provided for all of the vaccine components No orders of the defined types were placed in this encounter.   Caryl Pina, MD [facility] 03/05/2020, 4:00 PM

## 2020-03-06 LAB — CBC WITH DIFFERENTIAL/PLATELET
Basophils Absolute: 0.1 10*3/uL (ref 0.0–0.2)
Basos: 1 %
EOS (ABSOLUTE): 0.4 10*3/uL (ref 0.0–0.4)
Eos: 4 %
Hematocrit: 43.7 % (ref 37.5–51.0)
Hemoglobin: 15 g/dL (ref 13.0–17.7)
Immature Grans (Abs): 0 10*3/uL (ref 0.0–0.1)
Immature Granulocytes: 0 %
Lymphocytes Absolute: 2.3 10*3/uL (ref 0.7–3.1)
Lymphs: 29 %
MCH: 29.8 pg (ref 26.6–33.0)
MCHC: 34.3 g/dL (ref 31.5–35.7)
MCV: 87 fL (ref 79–97)
Monocytes Absolute: 0.6 10*3/uL (ref 0.1–0.9)
Monocytes: 7 %
Neutrophils Absolute: 4.6 10*3/uL (ref 1.4–7.0)
Neutrophils: 59 %
Platelets: 250 10*3/uL (ref 150–450)
RBC: 5.04 x10E6/uL (ref 4.14–5.80)
RDW: 13.8 % (ref 11.6–15.4)
WBC: 7.9 10*3/uL (ref 3.4–10.8)

## 2020-03-06 LAB — CMP14+EGFR
ALT: 21 IU/L (ref 0–44)
AST: 22 IU/L (ref 0–40)
Albumin/Globulin Ratio: 1.9 (ref 1.2–2.2)
Albumin: 4.4 g/dL (ref 3.8–4.9)
Alkaline Phosphatase: 77 IU/L (ref 39–117)
BUN/Creatinine Ratio: 16 (ref 9–20)
BUN: 16 mg/dL (ref 6–24)
Bilirubin Total: 0.4 mg/dL (ref 0.0–1.2)
CO2: 25 mmol/L (ref 20–29)
Calcium: 9.4 mg/dL (ref 8.7–10.2)
Chloride: 104 mmol/L (ref 96–106)
Creatinine, Ser: 1.01 mg/dL (ref 0.76–1.27)
GFR calc Af Amer: 95 mL/min/{1.73_m2} (ref >59)
GFR calc non Af Amer: 82 mL/min/{1.73_m2} (ref >59)
Globulin, Total: 2.3 g/dL (ref 1.5–4.5)
Glucose: 146 mg/dL — ABNORMAL HIGH (ref 65–99)
Potassium: 4.1 mmol/L (ref 3.5–5.2)
Sodium: 144 mmol/L (ref 134–144)
Total Protein: 6.7 g/dL (ref 6.0–8.5)

## 2020-03-06 LAB — LIPID PANEL
Chol/HDL Ratio: 5.6 ratio — ABNORMAL HIGH (ref 0.0–5.0)
Cholesterol, Total: 195 mg/dL (ref 100–199)
HDL: 35 mg/dL — ABNORMAL LOW (ref >39)
LDL Chol Calc (NIH): 121 mg/dL — ABNORMAL HIGH (ref 0–99)
Triglycerides: 219 mg/dL — ABNORMAL HIGH (ref 0–149)
VLDL Cholesterol Cal: 39 mg/dL (ref 5–40)

## 2020-09-04 ENCOUNTER — Ambulatory Visit (INDEPENDENT_AMBULATORY_CARE_PROVIDER_SITE_OTHER): Payer: 59 | Admitting: Family Medicine

## 2020-09-04 ENCOUNTER — Other Ambulatory Visit: Payer: Self-pay

## 2020-09-04 ENCOUNTER — Encounter: Payer: Self-pay | Admitting: Family Medicine

## 2020-09-04 VITALS — BP 141/82 | HR 86 | Temp 97.0°F | Ht 65.0 in | Wt 194.0 lb

## 2020-09-04 DIAGNOSIS — E1159 Type 2 diabetes mellitus with other circulatory complications: Secondary | ICD-10-CM | POA: Diagnosis not present

## 2020-09-04 DIAGNOSIS — Z1211 Encounter for screening for malignant neoplasm of colon: Secondary | ICD-10-CM

## 2020-09-04 DIAGNOSIS — I152 Hypertension secondary to endocrine disorders: Secondary | ICD-10-CM | POA: Diagnosis not present

## 2020-09-04 DIAGNOSIS — E785 Hyperlipidemia, unspecified: Secondary | ICD-10-CM

## 2020-09-04 DIAGNOSIS — E1169 Type 2 diabetes mellitus with other specified complication: Secondary | ICD-10-CM

## 2020-09-04 LAB — BAYER DCA HB A1C WAIVED: HB A1C (BAYER DCA - WAIVED): 6.2 % (ref <7.0)

## 2020-09-04 MED ORDER — TRIAMCINOLONE ACETONIDE 0.1 % EX CREA: 1.0000 "application " | TOPICAL_CREAM | Freq: Two times a day (BID) | CUTANEOUS | 1 refills | Status: DC

## 2020-09-04 NOTE — Progress Notes (Signed)
BP (!) 141/82   Pulse 86   Temp (!) 97 F (36.1 C)   Ht 5\' 5"  (1.651 m)   Wt 194 lb (88 kg)   SpO2 96%   BMI 32.28 kg/m    Subjective:   Patient ID: Antonio Fisher, male    DOB: 03-01-1961, 59 y.o.   MRN: [id number]  HPI: Antonio Fisher is a 59 y.o. male presenting on 09/04/2020 for Medical Management of Chronic Issues, Diabetes, and Hypertension   HPI Type 2 diabetes mellitus Patient comes in today for recheck of his diabetes. Patient has been currently taking Trulicity and glimepiride and Metformin. Patient is currently on an ACE inhibitor/ARB. Patient has seen an ophthalmologist this year. Patient still has cracking on the bottom of his feet and tried athlete's foot medicine and powder and spray and moisturizing cream and is still not doing good for him.. The symptom started onset as an adult hypertension hyperlipidemia ARE RELATED TO DM   Hypertension Patient is currently on lisinopril, and their blood pressure today is 141/82. Patient denies any lightheadedness or dizziness. Patient denies headaches, blurred vision, chest pains, shortness of breath, or weakness. Denies any side effects from medication and is content with current medication.   Hyperlipidemia Patient is coming in for recheck of his hyperlipidemia. The patient is currently taking no medication currently, is resistant to trying a statin at this point.. They deny any issues with myalgias or history of liver damage from it. They deny any focal numbness or weakness or chest pain.   Relevant past medical, surgical, family and social history reviewed and updated as indicated. Interim medical history since our last visit reviewed. Allergies and medications reviewed and updated.  Review of Systems  Constitutional: Negative for chills and fever.  Respiratory: Negative for shortness of breath and wheezing.   Cardiovascular: Negative for chest pain and leg swelling.  Musculoskeletal: Negative for back pain and gait  problem.  Skin: Negative for rash.  Neurological: Negative for dizziness, weakness and light-headedness.  All other systems reviewed and are negative.   Per HPI unless specifically indicated above   Allergies as of 09/04/2020   No Known Allergies     Medication List       Accurate as of September 04, 2020  3:50 PM. If you have any questions, ask your nurse or doctor.        glimepiride 4 MG tablet Commonly known as: AMARYL TAKE ONE TABLET BY MOUTH ONCE DAILY BEFORE BREAKFAST   indomethacin 25 MG capsule Commonly known as: INDOCIN Take 1 capsule (25 mg total) by mouth 2 (two) times daily with a meal.   lisinopril 2.5 MG tablet Commonly known as: ZESTRIL Take 1 tablet (2.5 mg total) by mouth daily.   metFORMIN 1000 MG tablet Commonly known as: GLUCOPHAGE Take 1 tablet (1,000 mg total) by mouth 2 (two) times daily with a meal. Must be seen for any further refills   triamcinolone cream 0.1 % Commonly known as: KENALOG Apply 1 application topically 2 (two) times daily. Started by: September 06, 2020 [name], MD   Trulicity 0.75 MG/0.5ML Sopn Generic drug: Dulaglutide Inject 0.75 mg into the skin once a week.        Objective:   BP (!) 141/82   Pulse 86   Temp (!) 97 F (36.1 C)   Ht 5\' 5"  (1.651 m)   Wt 194 lb (88 kg)   SpO2 96%   BMI 32.28 kg/m   Wt Readings from Last  3 Encounters:  09/04/20 194 lb (88 kg)  03/05/20 195 lb 8 oz (88.7 kg)  01/31/19 204 lb 3.2 oz (92.6 kg)    Physical Exam Vitals and nursing note reviewed.  Constitutional:      General: He is not in acute distress.    Appearance: He is well-developed. He is not diaphoretic.  Eyes:     General: No scleral icterus.    Conjunctiva/sclera: Conjunctivae normal.  Neck:     Thyroid: No thyromegaly.  Cardiovascular:     Rate and Rhythm: Normal rate and regular rhythm.     Heart sounds: Normal heart sounds. No murmur heard.   Pulmonary:     Effort: Pulmonary effort is normal. No respiratory  distress.     Breath sounds: Normal breath sounds. No wheezing.  Musculoskeletal:        General: Normal range of motion.     Cervical back: Neck supple.  Lymphadenopathy:     Cervical: No cervical adenopathy.  Skin:    General: Skin is warm and dry.     Findings: No rash.  Neurological:     Mental Status: He is alert and oriented to person, place, and time.     Coordination: Coordination normal.  Psychiatric:        Behavior: Behavior normal.     Diabetic Foot Exam - Simple   Simple Foot Form Diabetic Foot exam was performed with the following findings: Yes 09/04/2020  3:50 PM  Visual Inspection See comments: Yes Sensation Testing Intact to touch and monofilament testing bilaterally: Yes Pulse Check Posterior Tibialis and Dorsalis pulse intact bilaterally: Yes Comments Patient has cracking and peeling on both of his feet on the bottom and a spot this for some time.  Is on the sole of his whole feet on both sides.      Assessment & Plan:   Problem List Items Addressed This Visit      Cardiovascular and Mediastinum   Hypertension associated with diabetes (HCC)     Endocrine   Type 2 diabetes mellitus (HCC) - Primary   Relevant Orders   Bayer DCA Hb A1c Waived (Completed)   Hyperlipidemia associated with type 2 diabetes mellitus (HCC)    Other Visit Diagnoses    Colon cancer screening       Relevant Orders   Cologuard      A1c is 6.2 which looks good, continue current treatment.  We will try triamcinolone, he says has fought this on his feet for long time, may consider dermatology or podiatry but he is resistant towards this Follow up plan: Return in about 3 months (around 12/05/2020), or if symptoms worsen or fail to improve, for DM and hypertension cholesterol.  Counseling provided for all of the vaccine components Orders Placed This Encounter  Procedures  . Bayer DCA Hb A1c Waived  . Cologuard    Arville Care, MD [facility] 09/04/2020, 3:50 PM

## 2020-10-22 ENCOUNTER — Telehealth: Payer: Self-pay

## 2020-10-22 NOTE — Telephone Encounter (Signed)
PA submitted for Trulicity to Cover My Meds In Process  [place] Key: [id number] - PA Case ID: [id number] Need help? Call us at [phone] Status Sent to [facility] Trulicity 0.75MG /0.5ML pen-injectors Form [facility] Electronic Prior Authorization Form [phone] NCPDP)

## 2020-10-22 NOTE — Telephone Encounter (Signed)
Antonio Fisher Key: [id number] - [place] Case ID: [id number] Need help? Call us at [phone] Outcome Approvedtoday Request Reference Number: [id number]. TRULICITY INJ 0.75/0.5 is approved through 10/22/2021. Your patient may now fill this prescription and it will be covered. Drug Trulicity 0.75MG /0.5ML pen-injectors Form [employer] Electronic Prior Authorization Form [phone] NCPDP)

## 2020-12-07 ENCOUNTER — Ambulatory Visit: Payer: No Typology Code available for payment source | Admitting: Family Medicine

## 2020-12-07 ENCOUNTER — Other Ambulatory Visit: Payer: Self-pay

## 2020-12-07 ENCOUNTER — Encounter: Payer: Self-pay | Admitting: Family Medicine

## 2020-12-07 VITALS — BP 135/80 | HR 83 | Ht 65.0 in | Wt 195.0 lb

## 2020-12-07 DIAGNOSIS — E1169 Type 2 diabetes mellitus with other specified complication: Secondary | ICD-10-CM

## 2020-12-07 DIAGNOSIS — E785 Hyperlipidemia, unspecified: Secondary | ICD-10-CM

## 2020-12-07 DIAGNOSIS — I152 Hypertension secondary to endocrine disorders: Secondary | ICD-10-CM | POA: Diagnosis not present

## 2020-12-07 DIAGNOSIS — E1159 Type 2 diabetes mellitus with other circulatory complications: Secondary | ICD-10-CM | POA: Diagnosis not present

## 2020-12-07 LAB — BAYER DCA HB A1C WAIVED: HB A1C (BAYER DCA - WAIVED): 6.3 % (ref <7.0)

## 2020-12-07 MED ORDER — GLIMEPIRIDE 4 MG PO TABS: ORAL_TABLET | ORAL | 3 refills | Status: DC

## 2020-12-07 MED ORDER — LISINOPRIL 2.5 MG PO TABS: 2.5000 mg | ORAL_TABLET | Freq: Every day | ORAL | 3 refills | Status: DC

## 2020-12-07 MED ORDER — METFORMIN HCL 1000 MG PO TABS: 1000.0000 mg | ORAL_TABLET | Freq: Two times a day (BID) | ORAL | 3 refills | Status: DC

## 2020-12-07 MED ORDER — TRULICITY 0.75 MG/0.5ML ~~LOC~~ SOAJ: 0.7500 mg | SUBCUTANEOUS | 3 refills | Status: DC

## 2020-12-07 NOTE — Progress Notes (Signed)
BP 135/80   Pulse 83   Ht 5\' 5"  (1.651 m)   Wt 195 lb (88.5 kg)   SpO2 97%   BMI 32.45 kg/m    Subjective:   Patient ID: Antonio Fisher, male    DOB: Jan 17, 1961, 60 y.o.   MRN: [id number]  HPI: Antonio Fisher is a 60 y.o. male presenting on 12/07/2020 for Medical Management of Chronic Issues and Diabetes   HPI Type 2 diabetes mellitus Patient comes in today for recheck of his diabetes. Patient has been currently taking Trulicity and Metformin, A1c is 6.3. Patient is currently on an ACE inhibitor/ARB. Patient has not seen an ophthalmologist this year. Patient denies any issues with their feet. The symptom started onset as an adult hypertension and hyperlipidemia ARE RELATED TO DM   Hyperlipidemia Patient is coming in for recheck of his hyperlipidemia. The patient is currently taking no medication currently. They deny any issues with myalgias or history of liver damage from it. They deny any focal numbness or weakness or chest pain.   Hypertension Patient is currently on lisinopril, and their blood pressure today is 135/80. Patient denies any lightheadedness or dizziness. Patient denies headaches, blurred vision, chest pains, shortness of breath, or weakness. Denies any side effects from medication and is content with current medication.   Relevant past medical, surgical, family and social history reviewed and updated as indicated. Interim medical history since our last visit reviewed. Allergies and medications reviewed and updated.  Review of Systems  Constitutional: Negative for chills and fever.  Eyes: Negative for visual disturbance.  Respiratory: Negative for shortness of breath and wheezing.   Cardiovascular: Negative for chest pain and leg swelling.  Musculoskeletal: Negative for back pain and gait problem.  Skin: Negative for rash.  Neurological: Negative for dizziness, weakness and numbness.  All other systems reviewed and are negative.   Per HPI unless  specifically indicated above   Allergies as of 12/07/2020   No Known Allergies     Medication List       Accurate as of December 07, 2020  3:39 PM. If you have any questions, ask your nurse or doctor.        glimepiride 4 MG tablet Commonly known as: AMARYL TAKE ONE TABLET BY MOUTH ONCE DAILY BEFORE BREAKFAST   indomethacin 25 MG capsule Commonly known as: INDOCIN Take 1 capsule (25 mg total) by mouth 2 (two) times daily with a meal.   lisinopril 2.5 MG tablet Commonly known as: ZESTRIL Take 1 tablet (2.5 mg total) by mouth daily.   metFORMIN 1000 MG tablet Commonly known as: GLUCOPHAGE Take 1 tablet (1,000 mg total) by mouth 2 (two) times daily with a meal. Must be seen for any further refills   triamcinolone 0.1 % Commonly known as: KENALOG Apply 1 application topically 2 (two) times daily.   Trulicity 0.75 MG/0.5ML Sopn Generic drug: Dulaglutide Inject 0.75 mg into the skin once a week.        Objective:   BP 135/80   Pulse 83   Ht 5\' 5"  (1.651 m)   Wt 195 lb (88.5 kg)   SpO2 97%   BMI 32.45 kg/m   Wt Readings from Last 3 Encounters:  12/07/20 195 lb (88.5 kg)  09/04/20 194 lb (88 kg)  03/05/20 195 lb 8 oz (88.7 kg)    Physical Exam Vitals and nursing note reviewed.  Constitutional:      General: He is not in acute distress.  Appearance: He is well-developed and well-nourished. He is not diaphoretic.  Eyes:     General: No scleral icterus.    Extraocular Movements: EOM normal.     Conjunctiva/sclera: Conjunctivae normal.  Neck:     Thyroid: No thyromegaly.  Cardiovascular:     Rate and Rhythm: Normal rate and regular rhythm.     Pulses: Intact distal pulses.     Heart sounds: Normal heart sounds. No murmur heard.   Pulmonary:     Effort: Pulmonary effort is normal. No respiratory distress.     Breath sounds: Normal breath sounds. No wheezing.  Musculoskeletal:        General: No edema. Normal range of motion.     Cervical back: Neck  supple.  Lymphadenopathy:     Cervical: No cervical adenopathy.  Skin:    General: Skin is warm and dry.     Findings: No rash.  Neurological:     Mental Status: He is alert and oriented to person, place, and time.     Coordination: Coordination normal.  Psychiatric:        Mood and Affect: Mood and affect normal.        Behavior: Behavior normal.       Assessment & Plan:   Problem List Items Addressed This Visit      Cardiovascular and Mediastinum   Hypertension associated with diabetes (HCC)   Relevant Medications   metFORMIN (GLUCOPHAGE) 1000 MG tablet   lisinopril (ZESTRIL) 2.5 MG tablet   glimepiride (AMARYL) 4 MG tablet   Dulaglutide (TRULICITY) 0.75 MG/0.5ML SOPN     Endocrine   Type 2 diabetes mellitus (HCC) - Primary   Relevant Medications   metFORMIN (GLUCOPHAGE) 1000 MG tablet   lisinopril (ZESTRIL) 2.5 MG tablet   glimepiride (AMARYL) 4 MG tablet   Dulaglutide (TRULICITY) 0.75 MG/0.5ML SOPN   Other Relevant Orders   Bayer DCA Hb A1c Waived   Hyperlipidemia associated with type 2 diabetes mellitus (HCC)   Relevant Medications   metFORMIN (GLUCOPHAGE) 1000 MG tablet   lisinopril (ZESTRIL) 2.5 MG tablet   glimepiride (AMARYL) 4 MG tablet   Dulaglutide (TRULICITY) 0.75 MG/0.5ML SOPN      A1c is 6.3, continue current medication, no changes, he has been losing weight and keeping it under control.  No change in blood pressure medication either. Follow up plan: Return in about 3 months (around 03/07/2021), or if symptoms worsen or fail to improve, for Diabetes hypertension .  Counseling provided for all of the vaccine components Orders Placed This Encounter  Procedures  . Bayer [facility] Hb A1c Waived    Arville Care, MD [facility] 12/07/2020, 3:39 PM

## 2020-12-09 ENCOUNTER — Other Ambulatory Visit: Payer: Self-pay

## 2020-12-09 ENCOUNTER — Ambulatory Visit (INDEPENDENT_AMBULATORY_CARE_PROVIDER_SITE_OTHER): Payer: No Typology Code available for payment source

## 2020-12-09 DIAGNOSIS — Z23 Encounter for immunization: Secondary | ICD-10-CM

## 2020-12-09 NOTE — Progress Notes (Signed)
   Covid-19 Vaccination Clinic  Name:  Antonio Fisher    MRN: [id number] DOB: 05-22-1961  12/09/2020  [name] was observed post Covid-19 immunization for 15 minutes without incident. He was provided with Vaccine Information Sheet and instruction to access the V-Safe system.   [name] was instructed to call 911 with any severe reactions post vaccine: Marland Kitchen Difficulty breathing  . Swelling of face and throat  . A fast heartbeat  . A bad rash all over body  . Dizziness and weakness   Immunizations Administered    Name Date Dose VIS Date Route   Moderna Covid-19 Booster Vaccine 12/09/2020  3:00 PM 0.25 mL 09/02/2020 Intramuscular   Manufacturer: Moderna   Lot: [id number]   NDC: 52778-242-35

## 2021-03-09 ENCOUNTER — Ambulatory Visit: Payer: No Typology Code available for payment source | Admitting: Family Medicine

## 2021-03-09 ENCOUNTER — Encounter: Payer: Self-pay | Admitting: Family Medicine

## 2021-03-09 ENCOUNTER — Other Ambulatory Visit: Payer: Self-pay

## 2021-03-09 VITALS — BP 141/89 | HR 81 | Ht 65.0 in | Wt 192.0 lb

## 2021-03-09 DIAGNOSIS — E1169 Type 2 diabetes mellitus with other specified complication: Secondary | ICD-10-CM

## 2021-03-09 DIAGNOSIS — E785 Hyperlipidemia, unspecified: Secondary | ICD-10-CM

## 2021-03-09 DIAGNOSIS — M1990 Unspecified osteoarthritis, unspecified site: Secondary | ICD-10-CM

## 2021-03-09 DIAGNOSIS — E1159 Type 2 diabetes mellitus with other circulatory complications: Secondary | ICD-10-CM

## 2021-03-09 DIAGNOSIS — I152 Hypertension secondary to endocrine disorders: Secondary | ICD-10-CM | POA: Diagnosis not present

## 2021-03-09 LAB — BAYER DCA HB A1C WAIVED: HB A1C (BAYER DCA - WAIVED): 5.9 % (ref <7.0)

## 2021-03-09 MED ORDER — INDOMETHACIN 25 MG PO CAPS
25.0000 mg | ORAL_CAPSULE | Freq: Two times a day (BID) | ORAL | 3 refills | Status: DC
Start: 2021-03-09 — End: 2022-01-12

## 2021-03-09 NOTE — Progress Notes (Signed)
BP (!) 141/89   Pulse 81   Ht _0  (1.651 m)   Wt 192 lb (87.1 kg)   SpO2 96%   BMI 31.95 kg/m    Subjective:   Patient ID: Antonio Fisher, male    DOB: 1961/06/29, 60 y.o.   MRN: [id number]  HPI: Antonio Fisher is a 60 y.o. male presenting on 03/09/2021 for Medical Management of Chronic Issues, Diabetes, Hyperlipidemia, and Hypertension   HPI Hypertension Patient is currently on lisinopril, and their blood pressure today is 141/89. Patient denies any lightheadedness or dizziness. Patient denies headaches, blurred vision, chest pains, shortness of breath, or weakness. Denies any side effects from medication and is content with current medication.   Type 2 diabetes mellitus Patient comes in today for recheck of his diabetes. Patient has been currently taking Trulicity and metformin and glimepiride, A1c today is 5.9. Patient is currently on an ACE inhibitor/ARB. Patient has not seen an ophthalmologist this year. Patient denies any issues with their feet. The symptom started onset as an adult hyperlipidemia and hypertension ARE RELATED TO DM   Hyperlipidemia Patient is coming in for recheck of his hyperlipidemia. The patient is currently taking no medication currently, monitoring and diet control.  Recheck labs today. They deny any issues with myalgias or history of liver damage from it. They deny any focal numbness or weakness or chest pain.   Relevant past medical, surgical, family and social history reviewed and updated as indicated. Interim medical history since our last visit reviewed. Allergies and medications reviewed and updated.  Review of Systems  Constitutional: Negative for chills and fever.  Eyes: Negative for visual disturbance.  Respiratory: Negative for shortness of breath and wheezing.   Cardiovascular: Negative for chest pain and leg swelling.  Musculoskeletal: Positive for arthralgias and joint swelling. Negative for back pain and gait problem.  Skin: Negative  for rash.  All other systems reviewed and are negative.   Per HPI unless specifically indicated above   Allergies as of 03/09/2021   No Known Allergies     Medication List       Accurate as of March 09, 2021  3:46 PM. If you have any questions, ask your nurse or doctor.        STOP taking these medications   glimepiride 4 MG tablet Commonly known as: AMARYL Stopped by: Fransisca Kaufmann Phu Record, MD     TAKE these medications   indomethacin 25 MG capsule Commonly known as: INDOCIN Take 1 capsule (25 mg total) by mouth 2 (two) times daily with a meal.   lisinopril 2.5 MG tablet Commonly known as: ZESTRIL Take 1 tablet (2.5 mg total) by mouth daily.   metFORMIN 1000 MG tablet Commonly known as: GLUCOPHAGE Take 1 tablet (1,000 mg total) by mouth 2 (two) times daily with a meal. Must be seen for any further refills   triamcinolone cream 0.1 % Commonly known as: KENALOG Apply 1 application topically 2 (two) times daily.   Trulicity 5.99 JT/7.0VX Sopn Generic drug: Dulaglutide Inject 0.75 mg into the skin once a week.        Objective:   BP (!) 141/89   Pulse 81   Ht _1  (1.651 m)   Wt 192 lb (87.1 kg)   SpO2 96%   BMI 31.95 kg/m   Wt Readings from Last 3 Encounters:  03/09/21 192 lb (87.1 kg)  12/07/20 195 lb (88.5 kg)  09/04/20 194 lb (88 kg)    Physical Exam  Vitals and nursing note reviewed.  Constitutional:      General: He is not in acute distress.    Appearance: He is well-developed. He is not diaphoretic.  Eyes:     General: No scleral icterus.    Conjunctiva/sclera: Conjunctivae normal.  Neck:     Thyroid: No thyromegaly.  Cardiovascular:     Rate and Rhythm: Normal rate and regular rhythm.     Heart sounds: Normal heart sounds. No murmur heard.   Pulmonary:     Effort: Pulmonary effort is normal. No respiratory distress.     Breath sounds: Normal breath sounds. No wheezing.  Musculoskeletal:        General: Normal range of motion.      Cervical back: Neck supple.  Lymphadenopathy:     Cervical: No cervical adenopathy.  Skin:    General: Skin is warm and dry.     Findings: No rash.  Neurological:     Mental Status: He is alert and oriented to person, place, and time.     Coordination: Coordination normal.  Psychiatric:        Behavior: Behavior normal.       Assessment & Plan:   Problem List Items Addressed This Visit      Cardiovascular and Mediastinum   Hypertension associated with diabetes ([place])   Relevant Orders   CBC with Differential/Platelet   CMP14+EGFR   Lipid panel   Bayer DCA Hb A1c Waived   PSA, total and free     Endocrine   Type 2 diabetes mellitus ([place]) - Primary   Relevant Orders   CBC with Differential/Platelet   CMP14+EGFR   Lipid panel   Bayer DCA Hb A1c Waived   PSA, total and free   Hyperlipidemia associated with type 2 diabetes mellitus (HCC)   Relevant Orders   CBC with Differential/Platelet   CMP14+EGFR   Lipid panel   Bayer DCA Hb A1c Waived   PSA, total and free    Other Visit Diagnoses    Arthritis       Relevant Medications   indomethacin (INDOCIN) 25 MG capsule      Patient's A1c is 5.9, will stop the glimepiride, for now keep the metformin and Trulicity  Patient uses indomethacin for inflammation and arthritic issues in his hands.  Sent refill for him Follow up plan: Return in about 3 months (around 06/08/2021), or if symptoms worsen or fail to improve, for Diabetes and hypertension and cholesterol recheck.  Counseling provided for all of the vaccine components Orders Placed This Encounter  Procedures  . CBC with Differential/Platelet  . CMP14+EGFR  . Lipid panel  . Bayer DCA Hb A1c Waived  . PSA, total and free    Caryl Pina, MD [facility] 03/09/2021, 3:46 PM

## 2021-03-10 LAB — LIPID PANEL
Chol/HDL Ratio: 5.5 ratio — ABNORMAL HIGH (ref 0.0–5.0)
Cholesterol, Total: 204 mg/dL — ABNORMAL HIGH (ref 100–199)
HDL: 37 mg/dL — ABNORMAL LOW (ref >39)
LDL Chol Calc (NIH): 122 mg/dL — ABNORMAL HIGH (ref 0–99)
Triglycerides: 254 mg/dL — ABNORMAL HIGH (ref 0–149)
VLDL Cholesterol Cal: 45 mg/dL — ABNORMAL HIGH (ref 5–40)

## 2021-03-10 LAB — CMP14+EGFR
ALT: 21 IU/L (ref 0–44)
AST: 21 IU/L (ref 0–40)
Albumin/Globulin Ratio: 2.2 (ref 1.2–2.2)
Albumin: 4.6 g/dL (ref 3.8–4.9)
Alkaline Phosphatase: 82 IU/L (ref 44–121)
BUN/Creatinine Ratio: 14 (ref 9–20)
BUN: 15 mg/dL (ref 6–24)
Bilirubin Total: 0.3 mg/dL (ref 0.0–1.2)
CO2: 26 mmol/L (ref 20–29)
Calcium: 9.8 mg/dL (ref 8.7–10.2)
Chloride: 106 mmol/L (ref 96–106)
Creatinine, Ser: 1.09 mg/dL (ref 0.76–1.27)
Globulin, Total: 2.1 g/dL (ref 1.5–4.5)
Glucose: 99 mg/dL (ref 65–99)
Potassium: 4.4 mmol/L (ref 3.5–5.2)
Sodium: 147 mmol/L — ABNORMAL HIGH (ref 134–144)
Total Protein: 6.7 g/dL (ref 6.0–8.5)
eGFR: 79 mL/min/{1.73_m2} (ref >59)

## 2021-03-10 LAB — CBC WITH DIFFERENTIAL/PLATELET
Basophils Absolute: 0.1 10*3/uL (ref 0.0–0.2)
Basos: 1 %
EOS (ABSOLUTE): 0.3 10*3/uL (ref 0.0–0.4)
Eos: 3 %
Hematocrit: 43.5 % (ref 37.5–51.0)
Hemoglobin: 14.9 g/dL (ref 13.0–17.7)
Immature Grans (Abs): 0 10*3/uL (ref 0.0–0.1)
Immature Granulocytes: 0 %
Lymphocytes Absolute: 2.2 10*3/uL (ref 0.7–3.1)
Lymphs: 24 %
MCH: 28.9 pg (ref 26.6–33.0)
MCHC: 34.3 g/dL (ref 31.5–35.7)
MCV: 84 fL (ref 79–97)
Monocytes Absolute: 0.7 10*3/uL (ref 0.1–0.9)
Monocytes: 7 %
Neutrophils Absolute: 5.9 10*3/uL (ref 1.4–7.0)
Neutrophils: 65 %
Platelets: 247 10*3/uL (ref 150–450)
RBC: 5.16 x10E6/uL (ref 4.14–5.80)
RDW: 14.2 % (ref 11.6–15.4)
WBC: 9.1 10*3/uL (ref 3.4–10.8)

## 2021-03-10 LAB — PSA, TOTAL AND FREE
PSA, Free Pct: 40.4 %
PSA, Free: 0.93 ng/mL
Prostate Specific Ag, Serum: 2.3 ng/mL (ref 0.0–4.0)

## 2021-03-11 ENCOUNTER — Other Ambulatory Visit: Payer: Self-pay

## 2021-03-11 MED ORDER — ROSUVASTATIN CALCIUM 10 MG PO TABS: 10.0000 mg | ORAL_TABLET | Freq: Every evening | ORAL | 1 refills | Status: DC

## 2021-06-14 ENCOUNTER — Other Ambulatory Visit: Payer: Self-pay

## 2021-06-14 ENCOUNTER — Ambulatory Visit: Payer: No Typology Code available for payment source | Admitting: Family Medicine

## 2021-06-14 ENCOUNTER — Encounter: Payer: Self-pay | Admitting: Family Medicine

## 2021-06-14 VITALS — BP 140/83 | HR 70 | Ht 65.0 in | Wt 184.0 lb

## 2021-06-14 DIAGNOSIS — E1169 Type 2 diabetes mellitus with other specified complication: Secondary | ICD-10-CM

## 2021-06-14 DIAGNOSIS — Z23 Encounter for immunization: Secondary | ICD-10-CM | POA: Diagnosis not present

## 2021-06-14 DIAGNOSIS — E1159 Type 2 diabetes mellitus with other circulatory complications: Secondary | ICD-10-CM

## 2021-06-14 DIAGNOSIS — E785 Hyperlipidemia, unspecified: Secondary | ICD-10-CM

## 2021-06-14 DIAGNOSIS — I152 Hypertension secondary to endocrine disorders: Secondary | ICD-10-CM | POA: Diagnosis not present

## 2021-06-14 LAB — BAYER DCA HB A1C WAIVED: HB A1C (BAYER DCA - WAIVED): 9.5 % — ABNORMAL HIGH (ref <7.0)

## 2021-06-14 MED ORDER — GLIMEPIRIDE 4 MG PO TABS: 4.0000 mg | ORAL_TABLET | Freq: Every day | ORAL | 3 refills | Status: DC

## 2021-06-14 NOTE — Progress Notes (Signed)
BP 140/83   Pulse 70   Ht 5\' 5"  (1.651 m)   Wt 184 lb (83.5 kg)   SpO2 99%   BMI 30.62 kg/m    Subjective:   Patient ID: Antonio Fisher, male    DOB: [birth year], 60 y.o.   MRN: [id number]  HPI: Antonio Fisher is a 60 y.o. male presenting on 06/14/2021 for Medical Management of Chronic Issues, Hyperlipidemia, and Diabetes   HPI Type 2 diabetes mellitus Patient comes in today for recheck of his diabetes. Patient has been currently taking Trulicity and metformin. Patient is currently on an ACE inhibitor/ARB. Patient has not seen an ophthalmologist this year. Patient denies any issues with their feet. The symptom started onset as an adult hypertension and hyperlipidemia ARE RELATED TO DM   Hypertension Patient is currently on lisinopril, and their blood pressure today is 140/83. Patient denies any lightheadedness or dizziness. Patient denies headaches, blurred vision, chest pains, shortness of breath, or weakness. Denies any side effects from medication and is content with current medication.   Hyperlipidemia Patient is coming in for recheck of his hyperlipidemia. The patient is currently taking Crestor, feels like he has stomach issues from it. They deny any issues with myalgias or history of liver damage from it. They deny any focal numbness or weakness or chest pain.   Relevant past medical, surgical, family and social history reviewed and updated as indicated. Interim medical history since our last visit reviewed. Allergies and medications reviewed and updated.  Review of Systems  Constitutional:  Negative for chills and fever.  Respiratory:  Negative for shortness of breath and wheezing.   Cardiovascular:  Negative for chest pain and leg swelling.  Musculoskeletal:  Negative for back pain and gait problem.  Skin:  Negative for rash.  Neurological:  Negative for dizziness, weakness and light-headedness.  All other systems reviewed and are negative.  Per HPI unless  specifically indicated above   Allergies as of 06/14/2021   No Known Allergies      Medication List        Accurate as of June 14, 2021  4:17 PM. If you have any questions, ask your nurse or doctor.          STOP taking these medications    rosuvastatin 10 MG tablet Commonly known as: Crestor Stopped by: June 16, 2021, MD       TAKE these medications    glimepiride 4 MG tablet Commonly known as: AMARYL Take 1 tablet (4 mg total) by mouth daily before breakfast. Started by: [name], MD   indomethacin 25 MG capsule Commonly known as: INDOCIN Take 1 capsule (25 mg total) by mouth 2 (two) times daily with a meal.   lisinopril 2.5 MG tablet Commonly known as: ZESTRIL Take 1 tablet (2.5 mg total) by mouth daily.   metFORMIN 1000 MG tablet Commonly known as: GLUCOPHAGE Take 1 tablet (1,000 mg total) by mouth 2 (two) times daily with a meal. Must be seen for any further refills   triamcinolone cream 0.1 % Commonly known as: KENALOG Apply 1 application topically 2 (two) times daily.   Trulicity 0.75 MG/0.5ML Sopn Generic drug: Dulaglutide Inject 0.75 mg into the skin once a week.         Objective:   BP 140/83   Pulse 70   Ht 5\' 5"  (1.651 m)   Wt 184 lb (83.5 kg)   SpO2 99%   BMI 30.62 kg/m   Wt Readings from  Last 3 Encounters:  06/14/21 184 lb (83.5 kg)  03/09/21 192 lb (87.1 kg)  12/07/20 195 lb (88.5 kg)    Physical Exam Vitals and nursing note reviewed.  Constitutional:      General: He is not in acute distress.    Appearance: He is well-developed. He is not diaphoretic.  Eyes:     General: No scleral icterus.    Conjunctiva/sclera: Conjunctivae normal.  Neck:     Thyroid: No thyromegaly.  Cardiovascular:     Rate and Rhythm: Normal rate and regular rhythm.     Heart sounds: Normal heart sounds. No murmur heard. Pulmonary:     Effort: Pulmonary effort is normal. No respiratory distress.     Breath sounds: Normal breath  sounds. No wheezing.  Musculoskeletal:        General: No swelling. Normal range of motion.     Cervical back: Neck supple.  Lymphadenopathy:     Cervical: No cervical adenopathy.  Skin:    General: Skin is warm and dry.     Findings: No rash.  Neurological:     Mental Status: He is alert and oriented to person, place, and time.     Coordination: Coordination normal.  Psychiatric:        Behavior: Behavior normal.      Assessment & Plan:   Problem List Items Addressed This Visit       Cardiovascular and Mediastinum   Hypertension associated with diabetes (HCC)   Relevant Medications   glimepiride (AMARYL) 4 MG tablet     Endocrine   Type 2 diabetes mellitus (HCC) - Primary   Relevant Medications   glimepiride (AMARYL) 4 MG tablet   Other Relevant Orders   Bayer DCA Hb A1c Waived   Hyperlipidemia associated with type 2 diabetes mellitus (HCC)   Relevant Medications   glimepiride (AMARYL) 4 MG tablet   Other Visit Diagnoses     Need for shingles vaccine       Relevant Orders   Varicella-zoster vaccine IM (Shingrix)     A1c is up at 9.5, will have him restart his glimepiride, he did not tolerate higher dose of Trulicity and metformin is already maxed out. He will check his blood sugar every day and call us with some numbers in 1 to 2 weeks or drop them off  He feels like he has trouble with the Crestor so we will stop that for now  Follow up plan: Return in about 3 months (around 09/14/2021), or if symptoms worsen or fail to improve, for Diabetes recheck.  Counseling provided for all of the vaccine components Orders Placed This Encounter  Procedures   Varicella-zoster vaccine IM (Shingrix)   Bayer DCA Hb A1c Waived    Arville Care, MD [facility] 06/14/2021, 4:17 PM

## 2021-07-14 ENCOUNTER — Telehealth: Payer: Self-pay

## 2021-07-14 NOTE — Telephone Encounter (Signed)
Received sugar readings from pt. Dettinger advised since sugars are up slightly on some days to keep monitoring and watch diet. Pt informed.  Values sent to be scanned.

## 2021-09-10 ENCOUNTER — Other Ambulatory Visit: Payer: Self-pay | Admitting: Family Medicine

## 2021-09-15 ENCOUNTER — Ambulatory Visit: Payer: No Typology Code available for payment source | Admitting: Family Medicine

## 2021-09-28 ENCOUNTER — Telehealth: Payer: Self-pay | Admitting: *Deleted

## 2021-09-28 NOTE — Telephone Encounter (Signed)
Key: [id number] Drug Trulicity 0.75MG /0.5ML pen-injectors  Sent to plan

## 2021-09-28 NOTE — Telephone Encounter (Signed)
Approvedtoday Request Reference Number: [id number]. TRULICITY INJ 0.75/0.5 is approved through 09/28/2022. Your patient may now fill this prescription and it will be covered. WM aware

## 2021-10-11 ENCOUNTER — Telehealth: Payer: Self-pay | Admitting: Family Medicine

## 2021-10-11 DIAGNOSIS — E1169 Type 2 diabetes mellitus with other specified complication: Secondary | ICD-10-CM

## 2021-10-11 DIAGNOSIS — I152 Hypertension secondary to endocrine disorders: Secondary | ICD-10-CM

## 2021-10-11 DIAGNOSIS — E785 Hyperlipidemia, unspecified: Secondary | ICD-10-CM

## 2021-10-11 DIAGNOSIS — E1159 Type 2 diabetes mellitus with other circulatory complications: Secondary | ICD-10-CM

## 2021-10-11 NOTE — Telephone Encounter (Signed)
Labs ordered, pt aware

## 2021-10-13 ENCOUNTER — Encounter: Payer: Self-pay | Admitting: Family Medicine

## 2021-10-13 ENCOUNTER — Ambulatory Visit: Payer: No Typology Code available for payment source | Admitting: Family Medicine

## 2021-10-13 VITALS — BP 144/83 | HR 78 | Ht 65.0 in | Wt 183.0 lb

## 2021-10-13 DIAGNOSIS — Z1211 Encounter for screening for malignant neoplasm of colon: Secondary | ICD-10-CM

## 2021-10-13 DIAGNOSIS — E785 Hyperlipidemia, unspecified: Secondary | ICD-10-CM

## 2021-10-13 DIAGNOSIS — E1169 Type 2 diabetes mellitus with other specified complication: Secondary | ICD-10-CM | POA: Diagnosis not present

## 2021-10-13 DIAGNOSIS — E1159 Type 2 diabetes mellitus with other circulatory complications: Secondary | ICD-10-CM

## 2021-10-13 DIAGNOSIS — Z23 Encounter for immunization: Secondary | ICD-10-CM

## 2021-10-13 DIAGNOSIS — I152 Hypertension secondary to endocrine disorders: Secondary | ICD-10-CM

## 2021-10-13 NOTE — Progress Notes (Signed)
BP (!) 144/83   Pulse 78   Ht 5\' 5"  (1.651 m)   Wt 183 lb (83 kg)   SpO2 98%   BMI 30.45 kg/m    Subjective:   Patient ID: Antonio Fisher, male    DOB: [birth year], 61 y.o.   MRN: [id number]  HPI: Antonio Fisher is a 60 y.o. male presenting on 10/13/2021 for No chief complaint on file.   HPI Type 2 diabetes mellitus Patient comes in today for recheck of his diabetes. Patient has been currently taking Trulicity and glimepiride and metformin, A1c pending. Patient is currently on an ACE inhibitor/ARB. Patient has seen an ophthalmologist this year. Patient denies any issues with their feet. The symptom started onset as an adult hypertension and hyperlipidemia ARE RELATED TO DM   Hypertension Patient is currently on lisinopril, and their blood pressure today is 144/83. Patient denies any lightheadedness or dizziness. Patient denies headaches, blurred vision, chest pains, shortness of breath, or weakness. Denies any side effects from medication and is content with current medication.   Hyperlipidemia Patient is coming in for recheck of his hyperlipidemia. The patient is currently taking none currently, focusing on diet.. They deny any issues with myalgias or history of liver damage from it. They deny any focal numbness or weakness or chest pain.   Relevant past medical, surgical, family and social history reviewed and updated as indicated. Interim medical history since our last visit reviewed. Allergies and medications reviewed and updated.  Review of Systems  Constitutional:  Negative for chills and fever.  Eyes:  Negative for visual disturbance.  Respiratory:  Negative for shortness of breath and wheezing.   Cardiovascular:  Negative for chest pain and leg swelling.  Musculoskeletal:  Negative for back pain and gait problem.  Skin:  Negative for rash.  Neurological:  Negative for dizziness, weakness and numbness.  All other systems reviewed and are negative.  Per HPI unless  specifically indicated above   Allergies as of 10/13/2021   No Known Allergies      Medication List        Accurate as of October 13, 2021  1:39 PM. If you have any questions, ask your nurse or doctor.          glimepiride 4 MG tablet Commonly known as: AMARYL Take 1 tablet (4 mg total) by mouth daily before breakfast.   indomethacin 25 MG capsule Commonly known as: INDOCIN Take 1 capsule (25 mg total) by mouth 2 (two) times daily with a meal.   lisinopril 2.5 MG tablet Commonly known as: ZESTRIL Take 1 tablet (2.5 mg total) by mouth daily.   metFORMIN 1000 MG tablet Commonly known as: GLUCOPHAGE Take 1 tablet (1,000 mg total) by mouth 2 (two) times daily with a meal. Must be seen for any further refills   triamcinolone cream 0.1 % Commonly known as: KENALOG Apply 1 application topically 2 (two) times daily.   Trulicity 0.75 MG/0.5ML Sopn Generic drug: Dulaglutide Inject 0.75 mg into the skin once a week.         Objective:   BP (!) 144/83   Pulse 78   Ht 5\' 5"  (1.651 m)   Wt 183 lb (83 kg)   SpO2 98%   BMI 30.45 kg/m   Wt Readings from Last 3 Encounters:  10/13/21 183 lb (83 kg)  06/14/21 184 lb (83.5 kg)  03/09/21 192 lb (87.1 kg)    Physical Exam Vitals and nursing note reviewed.  Constitutional:  General: He is not in acute distress.    Appearance: He is well-developed. He is not diaphoretic.  Eyes:     General: No scleral icterus.    Conjunctiva/sclera: Conjunctivae normal.  Neck:     Thyroid: No thyromegaly.  Cardiovascular:     Rate and Rhythm: Normal rate and regular rhythm.     Heart sounds: Normal heart sounds. No murmur heard. Pulmonary:     Effort: Pulmonary effort is normal. No respiratory distress.     Breath sounds: Normal breath sounds. No wheezing.  Musculoskeletal:        General: No swelling. Normal range of motion.     Cervical back: Neck supple.  Lymphadenopathy:     Cervical: No cervical adenopathy.  Skin:     General: Skin is warm and dry.     Findings: No rash.  Neurological:     Mental Status: He is alert and oriented to person, place, and time.     Coordination: Coordination normal.  Psychiatric:        Behavior: Behavior normal.      Assessment & Plan:   Problem List Items Addressed This Visit       Cardiovascular and Mediastinum   Hypertension associated with diabetes ([place])     Endocrine   Type 2 diabetes mellitus ([place])   Hyperlipidemia associated with type 2 diabetes mellitus ([place])   Other Visit Diagnoses     Colon cancer screening    -  Primary   Relevant Orders   Ambulatory referral to Gastroenterology   Need for shingles vaccine       Relevant Orders   Varicella-zoster vaccine IM (Shingrix)       A1c pending, was sent out, will likely come back tomorrow and the next day. Follow up plan: Return in about 3 months (around 01/11/2022), or if symptoms worsen or fail to improve, for Diabetes hypertension cholesterol.  Counseling provided for all of the vaccine components Orders Placed This Encounter  Procedures   Varicella-zoster vaccine IM (Shingrix)   Ambulatory referral to Gastroenterology    Caryl Pina, MD [facility] 10/13/2021, 1:39 PM

## 2021-10-14 LAB — CMP14+EGFR
ALT: 20 IU/L (ref 0–44)
AST: 20 IU/L (ref 0–40)
Albumin/Globulin Ratio: 1.7 (ref 1.2–2.2)
Albumin: 4.4 g/dL (ref 3.8–4.9)
Alkaline Phosphatase: 86 IU/L (ref 44–121)
BUN/Creatinine Ratio: 15 (ref 9–20)
BUN: 15 mg/dL (ref 6–24)
Bilirubin Total: 0.3 mg/dL (ref 0.0–1.2)
CO2: 26 mmol/L (ref 20–29)
Calcium: 9.7 mg/dL (ref 8.7–10.2)
Chloride: 106 mmol/L (ref 96–106)
Creatinine, Ser: 0.98 mg/dL (ref 0.76–1.27)
Globulin, Total: 2.6 g/dL (ref 1.5–4.5)
Glucose: 170 mg/dL — ABNORMAL HIGH (ref 70–99)
Potassium: 4.7 mmol/L (ref 3.5–5.2)
Sodium: 145 mmol/L — ABNORMAL HIGH (ref 134–144)
Total Protein: 7 g/dL (ref 6.0–8.5)
eGFR: 89 mL/min/{1.73_m2} (ref >59)

## 2021-10-14 LAB — CBC WITH DIFFERENTIAL/PLATELET
Basophils Absolute: 0.1 10*3/uL (ref 0.0–0.2)
Basos: 1 %
EOS (ABSOLUTE): 0.5 10*3/uL — ABNORMAL HIGH (ref 0.0–0.4)
Eos: 7 %
Hematocrit: 43.9 % (ref 37.5–51.0)
Hemoglobin: 15.2 g/dL (ref 13.0–17.7)
Immature Grans (Abs): 0 10*3/uL (ref 0.0–0.1)
Immature Granulocytes: 0 %
Lymphocytes Absolute: 2 10*3/uL (ref 0.7–3.1)
Lymphs: 27 %
MCH: 28.3 pg (ref 26.6–33.0)
MCHC: 34.6 g/dL (ref 31.5–35.7)
MCV: 82 fL (ref 79–97)
Monocytes Absolute: 0.5 10*3/uL (ref 0.1–0.9)
Monocytes: 7 %
Neutrophils Absolute: 4.3 10*3/uL (ref 1.4–7.0)
Neutrophils: 58 %
Platelets: 255 10*3/uL (ref 150–450)
RBC: 5.38 x10E6/uL (ref 4.14–5.80)
RDW: 13.6 % (ref 11.6–15.4)
WBC: 7.3 10*3/uL (ref 3.4–10.8)

## 2021-10-14 LAB — LIPID PANEL
Chol/HDL Ratio: 5.5 ratio — ABNORMAL HIGH (ref 0.0–5.0)
Cholesterol, Total: 216 mg/dL — ABNORMAL HIGH (ref 100–199)
HDL: 39 mg/dL — ABNORMAL LOW (ref >39)
LDL Chol Calc (NIH): 143 mg/dL — ABNORMAL HIGH (ref 0–99)
Triglycerides: 190 mg/dL — ABNORMAL HIGH (ref 0–149)
VLDL Cholesterol Cal: 34 mg/dL (ref 5–40)

## 2021-10-14 LAB — HEMOGLOBIN A1C
Est. average glucose Bld gHb Est-mCnc: 131 mg/dL
Hgb A1c MFr Bld: 6.2 % — ABNORMAL HIGH (ref 4.8–5.6)

## 2021-12-06 ENCOUNTER — Other Ambulatory Visit: Payer: Self-pay | Admitting: Family Medicine

## 2021-12-06 DIAGNOSIS — E1169 Type 2 diabetes mellitus with other specified complication: Secondary | ICD-10-CM

## 2022-01-01 ENCOUNTER — Other Ambulatory Visit: Payer: Self-pay | Admitting: Family Medicine

## 2022-01-01 DIAGNOSIS — E1169 Type 2 diabetes mellitus with other specified complication: Secondary | ICD-10-CM

## 2022-01-05 ENCOUNTER — Other Ambulatory Visit: Payer: Self-pay | Admitting: Family Medicine

## 2022-01-05 DIAGNOSIS — E1169 Type 2 diabetes mellitus with other specified complication: Secondary | ICD-10-CM

## 2022-01-07 ENCOUNTER — Other Ambulatory Visit: Payer: Self-pay | Admitting: *Deleted

## 2022-01-07 DIAGNOSIS — E1169 Type 2 diabetes mellitus with other specified complication: Secondary | ICD-10-CM

## 2022-01-07 MED ORDER — GLIMEPIRIDE 4 MG PO TABS: 4.0000 mg | ORAL_TABLET | Freq: Every day | ORAL | 0 refills | Status: DC

## 2022-01-09 ENCOUNTER — Encounter: Payer: Self-pay | Admitting: Family Medicine

## 2022-01-12 ENCOUNTER — Ambulatory Visit: Payer: No Typology Code available for payment source | Admitting: Family Medicine

## 2022-01-12 ENCOUNTER — Encounter: Payer: Self-pay | Admitting: Family Medicine

## 2022-01-12 VITALS — BP 135/79 | HR 81 | Ht 65.0 in | Wt 191.0 lb

## 2022-01-12 DIAGNOSIS — M1990 Unspecified osteoarthritis, unspecified site: Secondary | ICD-10-CM | POA: Diagnosis not present

## 2022-01-12 DIAGNOSIS — E1159 Type 2 diabetes mellitus with other circulatory complications: Secondary | ICD-10-CM | POA: Diagnosis not present

## 2022-01-12 DIAGNOSIS — E785 Hyperlipidemia, unspecified: Secondary | ICD-10-CM | POA: Diagnosis not present

## 2022-01-12 DIAGNOSIS — I152 Hypertension secondary to endocrine disorders: Secondary | ICD-10-CM

## 2022-01-12 DIAGNOSIS — E1169 Type 2 diabetes mellitus with other specified complication: Secondary | ICD-10-CM

## 2022-01-12 LAB — BAYER DCA HB A1C WAIVED: HB A1C (BAYER DCA - WAIVED): 5.5 % (ref 4.8–5.6)

## 2022-01-12 MED ORDER — GLIMEPIRIDE 4 MG PO TABS: 4.0000 mg | ORAL_TABLET | Freq: Every day | ORAL | 3 refills | Status: DC

## 2022-01-12 MED ORDER — LISINOPRIL 2.5 MG PO TABS: 2.5000 mg | ORAL_TABLET | Freq: Every day | ORAL | 3 refills | Status: DC

## 2022-01-12 MED ORDER — TRULICITY 0.75 MG/0.5ML ~~LOC~~ SOAJ: 0.7500 mg | SUBCUTANEOUS | 3 refills | Status: DC

## 2022-01-12 MED ORDER — METFORMIN HCL 1000 MG PO TABS: 1000.0000 mg | ORAL_TABLET | Freq: Two times a day (BID) | ORAL | 3 refills | Status: DC

## 2022-01-12 MED ORDER — INDOMETHACIN 25 MG PO CAPS: 25.0000 mg | ORAL_CAPSULE | Freq: Two times a day (BID) | ORAL | 3 refills | Status: DC

## 2022-01-12 NOTE — Progress Notes (Signed)
? ?BP 135/79   Pulse 81   Ht 5\' 5"  (1.651 m)   Wt 191 lb (86.6 kg)   SpO2 95%   BMI 31.78 kg/m?   ? ?Subjective:  ? ?Patient ID: Antonio Fisher, male    DOB: 1961/06/02, 61 y.o.   MRN: [id number] ? ?HPI: ?Antonio Fisher is a 61 y.o. male presenting on 01/12/2022 for Medical Management of Chronic Issues, Diabetes, and Hyperlipidemia ? ? ?HPI ?Type 2 diabetes mellitus ?Patient comes in today for recheck of his diabetes. Patient has been currently taking Trulicity and glimepiride and metformin. Patient is currently on an ACE inhibitor/ARB. Patient has not seen an ophthalmologist this year. Patient denies any issues with their feet. The symptom started onset as an adult hypertension and hyperlipidemia ARE RELATED TO DM  ? ?Hypertension ?Patient is currently on lisinopril, and their blood pressure today is 135/79. Patient denies any lightheadedness or dizziness. Patient denies headaches, blurred vision, chest pains, shortness of breath, or weakness. Denies any side effects from medication and is content with current medication.  ? ?Hyperlipidemia ?Patient is coming in for recheck of his hyperlipidemia. The patient is currently taking no medicine, said he could not tolerate the pravastatin and it made him feel bad.. They deny any issues with myalgias or history of liver damage from it. They deny any focal numbness or weakness or chest pain.  ? ?Relevant past medical, surgical, family and social history reviewed and updated as indicated. Interim medical history since our last visit reviewed. ?Allergies and medications reviewed and updated. ? ?Review of Systems  ?Constitutional:  Negative for chills and fever.  ?Eyes:  Negative for visual disturbance.  ?Respiratory:  Negative for shortness of breath and wheezing.   ?Cardiovascular:  Negative for chest pain and leg swelling.  ?Musculoskeletal:  Negative for back pain and gait problem.  ?Skin:  Negative for rash.  ?Neurological:  Negative for dizziness, weakness and  light-headedness.  ?All other systems reviewed and are negative. ? ?Per HPI unless specifically indicated above ? ? ?Allergies as of 01/12/2022   ?No Known Allergies ?  ? ?  ?Medication List  ?  ? ?  ? Accurate as of January 12, 2022  3:51 PM. If you have any questions, ask your nurse or doctor.  ?  ?  ? ?  ? ?glimepiride 4 MG tablet ?Commonly known as: AMARYL ?Take 1 tablet (4 mg total) by mouth daily before breakfast. ?  ?indomethacin 25 MG capsule ?Commonly known as: INDOCIN ?Take 1 capsule (25 mg total) by mouth 2 (two) times daily with a meal. ?  ?lisinopril 2.5 MG tablet ?Commonly known as: ZESTRIL ?Take 1 tablet (2.5 mg total) by mouth daily. ?  ?metFORMIN 1000 MG tablet ?Commonly known as: GLUCOPHAGE ?Take 1 tablet (1,000 mg total) by mouth 2 (two) times daily with a meal. ?What changed: See the new instructions. ?Changed by: January 14, 2022, MD ?  ?triamcinolone cream 0.1 % ?Commonly known as: KENALOG ?Apply 1 application topically 2 (two) times daily. ?  ?Trulicity 0.75 MG/0.5ML Sopn ?Generic drug: Dulaglutide ?Inject 0.75 mg into the skin once a week. ?What changed: See the new instructions. ?Changed by: Nils Pyle, MD ?  ? ?  ? ? ? ?Objective:  ? ?BP 135/79   Pulse 81   Ht 5\' 5"  (1.651 m)   Wt 191 lb (86.6 kg)   SpO2 95%   BMI 31.78 kg/m?   ?Wt Readings from Last 3 Encounters:  ?01/12/22 191 lb (  86.6 kg)  ?10/13/21 183 lb (83 kg)  ?06/14/21 184 lb (83.5 kg)  ?  ?Physical Exam ?Vitals and nursing note reviewed.  ?Constitutional:   ?   General: He is not in acute distress. ?   Appearance: He is well-developed. He is not diaphoretic.  ?Eyes:  ?   General: No scleral icterus. ?   Conjunctiva/sclera: Conjunctivae normal.  ?Neck:  ?   Thyroid: No thyromegaly.  ?Cardiovascular:  ?   Rate and Rhythm: Normal rate and regular rhythm.  ?   Heart sounds: Normal heart sounds. No murmur heard. ?Pulmonary:  ?   Effort: Pulmonary effort is normal. No respiratory distress.  ?   Breath sounds: Normal breath  sounds. No wheezing.  ?Musculoskeletal:     ?   General: No swelling. Normal range of motion.  ?   Cervical back: Neck supple.  ?Lymphadenopathy:  ?   Cervical: No cervical adenopathy.  ?Skin: ?   General: Skin is warm and dry.  ?   Findings: No rash.  ?Neurological:  ?   Mental Status: He is alert and oriented to person, place, and time.  ?   Coordination: Coordination normal.  ?Psychiatric:     ?   Behavior: Behavior normal.  ? ? ? ? ?Assessment & Plan:  ? ?Problem List Items Addressed This Visit   ? ?  ? Cardiovascular and Mediastinum  ? Hypertension associated with diabetes (HCC)  ? Relevant Medications  ? glimepiride (AMARYL) 4 MG tablet  ? lisinopril (ZESTRIL) 2.5 MG tablet  ? metFORMIN (GLUCOPHAGE) 1000 MG tablet  ? Dulaglutide (TRULICITY) 0.75 MG/0.5ML SOPN  ?  ? Endocrine  ? Type 2 diabetes mellitus (HCC) - Primary  ? Relevant Medications  ? glimepiride (AMARYL) 4 MG tablet  ? lisinopril (ZESTRIL) 2.5 MG tablet  ? metFORMIN (GLUCOPHAGE) 1000 MG tablet  ? Dulaglutide (TRULICITY) 0.75 MG/0.5ML SOPN  ? Other Relevant Orders  ? Bayer DCA Hb A1c Waived  ? Hyperlipidemia associated with type 2 diabetes mellitus (HCC)  ? Relevant Medications  ? glimepiride (AMARYL) 4 MG tablet  ? lisinopril (ZESTRIL) 2.5 MG tablet  ? metFORMIN (GLUCOPHAGE) 1000 MG tablet  ? Dulaglutide (TRULICITY) 0.75 MG/0.5ML SOPN  ? Other Relevant Orders  ? Bayer DCA Hb A1c Waived  ? ?Other Visit Diagnoses   ? ? Arthritis      ? Relevant Medications  ? indomethacin (INDOCIN) 25 MG capsule  ? ?  ?  ?, Seems to be doing well, continue current medicine. ?Follow up plan: ?Return in about 3 months (around 04/14/2022), or if symptoms worsen or fail to improve, for dm and htn and hld. ? ?Counseling provided for all of the vaccine components ?Orders Placed This Encounter  ?Procedures  ? Bayer DCA Hb A1c Waived  ? ? ?Arville Care, MD ?[facility] ?01/12/2022, 3:51 PM ? ? ?  ?

## 2022-04-15 ENCOUNTER — Encounter: Payer: Self-pay | Admitting: Family Medicine

## 2022-04-15 ENCOUNTER — Ambulatory Visit: Payer: No Typology Code available for payment source | Admitting: Family Medicine

## 2022-04-15 VITALS — BP 138/88 | HR 82 | Temp 98.0°F | Ht 65.0 in | Wt 192.0 lb

## 2022-04-15 DIAGNOSIS — E1159 Type 2 diabetes mellitus with other circulatory complications: Secondary | ICD-10-CM

## 2022-04-15 DIAGNOSIS — Z1211 Encounter for screening for malignant neoplasm of colon: Secondary | ICD-10-CM

## 2022-04-15 DIAGNOSIS — E1169 Type 2 diabetes mellitus with other specified complication: Secondary | ICD-10-CM | POA: Diagnosis not present

## 2022-04-15 DIAGNOSIS — R109 Unspecified abdominal pain: Secondary | ICD-10-CM

## 2022-04-15 DIAGNOSIS — E785 Hyperlipidemia, unspecified: Secondary | ICD-10-CM

## 2022-04-15 DIAGNOSIS — R10A2 Flank pain, left side: Secondary | ICD-10-CM

## 2022-04-15 DIAGNOSIS — I152 Hypertension secondary to endocrine disorders: Secondary | ICD-10-CM

## 2022-04-15 LAB — URINALYSIS, COMPLETE
Bilirubin, UA: NEGATIVE
Glucose, UA: NEGATIVE
Leukocytes,UA: NEGATIVE
Nitrite, UA: NEGATIVE
Specific Gravity, UA: >1.03 — ABNORMAL HIGH (ref 1.005–1.030)
Urobilinogen, Ur: 1 mg/dL (ref 0.2–1.0)
pH, UA: 5.5 (ref 5.0–7.5)

## 2022-04-15 LAB — MICROSCOPIC EXAMINATION
Bacteria, UA: NONE SEEN
Epithelial Cells (non renal): NONE SEEN /hpf (ref 0–10)
RBC, Urine: NONE SEEN /hpf (ref 0–2)
Renal Epithel, UA: NONE SEEN /hpf

## 2022-04-15 LAB — BAYER DCA HB A1C WAIVED: HB A1C (BAYER DCA - WAIVED): 5.7 % — ABNORMAL HIGH (ref 4.8–5.6)

## 2022-04-15 MED ORDER — TAMSULOSIN HCL 0.4 MG PO CAPS: 0.4000 mg | ORAL_CAPSULE | Freq: Every day | ORAL | 3 refills | Status: DC

## 2022-04-15 NOTE — Progress Notes (Signed)
BP 138/88   Pulse 82   Temp 98 F (36.7 C)   Ht '5\' 5"'  (1.651 m)   Wt 192 lb (87.1 kg)   SpO2 99%   BMI 31.95 kg/m    Subjective:   Patient ID: Antonio Fisher, male    DOB: 26-Sep-1961, 61 y.o.   MRN: [id number]  HPI: Antonio Fisher is a 61 y.o. male presenting on 04/15/2022 for Medical Management of Chronic Issues and Diabetes   HPI Type 2 diabetes mellitus Patient comes in today for recheck of his diabetes. Patient has been currently taking metformin and Trulicity, will stop glimepiride because he is doing so well. Patient is currently on an ACE inhibitor/ARB. Patient has not seen an ophthalmologist this year. Patient denies any issues with their feet. The symptom started onset as an adult hypertension and hyperlipidemia ARE RELATED TO DM   Hypertension Patient is currently on lisinopril, and their blood pressure today is 138/88. Patient denies any lightheadedness or dizziness. Patient denies headaches, blurred vision, chest pains, shortness of breath, or weakness. Denies any side effects from medication and is content with current medication.   Hyperlipidemia Patient is coming in for recheck of his hyperlipidemia. The patient is currently taking no medicine, has wanted to do diet control.. They deny any issues with myalgias or history of liver damage from it. They deny any focal numbness or weakness or chest pain.   left lower back pain Patient is complaining of lower back pain that is been bothering him over the past 2 or 3 days, he says it will hurt when he is on his tractor and bouncing and he cannot get comfortable and has been hurting intermittently like that but it does cause him a lot of pain.  He says it can be quite severe at times.  He denies any dysuria or hematuria.  He denies any back issues or falls.  He does not have a history of back issues.  He says this just been hurting recently.  He is taking Advil to help with that and that does help some but then its been  coming back over the past few days.  Relevant past medical, surgical, family and social history reviewed and updated as indicated. Interim medical history since our last visit reviewed. Allergies and medications reviewed and updated.  Review of Systems  Constitutional:  Negative for chills and fever.  Eyes:  Negative for visual disturbance.  Respiratory:  Negative for shortness of breath and wheezing.   Cardiovascular:  Negative for chest pain and leg swelling.  Musculoskeletal:  Negative for back pain and gait problem.  Skin:  Negative for rash.  Neurological:  Negative for dizziness, weakness and light-headedness.  All other systems reviewed and are negative.  Per HPI unless specifically indicated above   Allergies as of 04/15/2022   No Known Allergies      Medication List        Accurate as of April 15, 2022  4:05 PM. If you have any questions, ask your nurse or doctor.          STOP taking these medications    glimepiride 4 MG tablet Commonly known as: AMARYL Stopped by: Fransisca Kaufmann Torianna Junio, MD       TAKE these medications    indomethacin 25 MG capsule Commonly known as: INDOCIN Take 1 capsule (25 mg total) by mouth 2 (two) times daily with a meal.   lisinopril 2.5 MG tablet Commonly known as: ZESTRIL Take 1  tablet (2.5 mg total) by mouth daily.   metFORMIN 1000 MG tablet Commonly known as: GLUCOPHAGE Take 1 tablet (1,000 mg total) by mouth 2 (two) times daily with a meal.   triamcinolone cream 0.1 % Commonly known as: KENALOG Apply 1 application topically 2 (two) times daily.   Trulicity 0.25 [place]/7.0WC Sopn Generic drug: Dulaglutide Inject 0.75 mg into the skin once a week.         Objective:   BP 138/88   Pulse 82   Temp 98 F (36.7 C)   Ht '5\' 5"'  (1.651 m)   Wt 192 lb (87.1 kg)   SpO2 99%   BMI 31.95 kg/m   Wt Readings from Last 3 Encounters:  04/15/22 192 lb (87.1 kg)  01/12/22 191 lb (86.6 kg)  10/13/21 183 lb (83 kg)    Physical  Exam Vitals and nursing note reviewed.  Constitutional:      General: He is not in acute distress.    Appearance: He is well-developed. He is not diaphoretic.  Eyes:     General: No scleral icterus.    Conjunctiva/sclera: Conjunctivae normal.  Neck:     Thyroid: No thyromegaly.  Cardiovascular:     Rate and Rhythm: Normal rate and regular rhythm.     Heart sounds: Normal heart sounds. No murmur heard. Pulmonary:     Effort: Pulmonary effort is normal. No respiratory distress.     Breath sounds: Normal breath sounds. No wheezing.  Musculoskeletal:        General: No swelling. Normal range of motion.     Cervical back: Neck supple.  Lymphadenopathy:     Cervical: No cervical adenopathy.  Skin:    General: Skin is warm and dry.     Findings: No rash.  Neurological:     Mental Status: He is alert and oriented to person, place, and time.     Coordination: Coordination normal.  Psychiatric:        Behavior: Behavior normal.    A1c 5.6  Assessment & Plan:   Problem List Items Addressed This Visit       Cardiovascular and Mediastinum   Hypertension associated with diabetes ([place])   Relevant Orders   CBC with Differential/Platelet   CMP14+EGFR   Lipid panel   Bayer DCA Hb A1c Waived   PSA, total and free     Endocrine   Type 2 diabetes mellitus ([place]) - Primary   Relevant Orders   CBC with Differential/Platelet   CMP14+EGFR   Lipid panel   Bayer DCA Hb A1c Waived   PSA, total and free   Hyperlipidemia associated with type 2 diabetes mellitus ([place])   Relevant Orders   CBC with Differential/Platelet   CMP14+EGFR   Lipid panel   Bayer DCA Hb A1c Waived   PSA, total and free   Other Visit Diagnoses     Colon cancer screening       Relevant Orders   Cologuard   Left flank pain       Relevant Orders   Urine Culture   Urinalysis, Complete       A1c 5.7 and continuous, will have him leave a urine to make sure that he does not have kidney stones.  If it does  show like kidney stones then will send Flomax.  If it does show like is not kidney stones then we might try something for inflammation and see if we can calm his back down.  No other change in medicine  Follow up plan: Return in about 3 months (around 07/16/2022), or if symptoms worsen or fail to improve, for Diabetes and hypertension:.  Counseling provided for all of the vaccine components Orders Placed This Encounter  Procedures   Urine Culture   CBC with Differential/Platelet   CMP14+EGFR   Lipid panel   Bayer DCA Hb A1c Waived   PSA, total and free   Cologuard   Urinalysis, Complete    Caryl Pina, MD [facility] 04/15/2022, 4:05 PM

## 2022-04-15 NOTE — Addendum Note (Signed)
Addended by: Dorene Sorrow on: 04/15/2022 04:58 PM   Modules accepted: Orders

## 2022-04-15 NOTE — Progress Notes (Signed)
Per [place] pts UA shows that pt is dehydrated and a possible kidney stone from the small trace of blood that was seen. Pt does not want to to have a CT scan at this time and will try Flomax as Dr. Warrick Parisian suggested. Flomax 0.4mg  daily #30 with 3 refills called in to [facility] in [place]. Pt is aware

## 2022-04-16 LAB — LIPID PANEL
Chol/HDL Ratio: 5 ratio (ref 0.0–5.0)
Cholesterol, Total: 208 mg/dL — ABNORMAL HIGH (ref 100–199)
HDL: 42 mg/dL (ref >39)
LDL Chol Calc (NIH): 130 mg/dL — ABNORMAL HIGH (ref 0–99)
Triglycerides: 203 mg/dL — ABNORMAL HIGH (ref 0–149)
VLDL Cholesterol Cal: 36 mg/dL (ref 5–40)

## 2022-04-16 LAB — CMP14+EGFR
ALT: 23 IU/L (ref 0–44)
AST: 21 IU/L (ref 0–40)
Albumin/Globulin Ratio: 2.3 — ABNORMAL HIGH (ref 1.2–2.2)
Albumin: 4.5 g/dL (ref 3.8–4.9)
Alkaline Phosphatase: 82 IU/L (ref 44–121)
BUN/Creatinine Ratio: 18 (ref 9–20)
BUN: 19 mg/dL (ref 6–24)
Bilirubin Total: 0.4 mg/dL (ref 0.0–1.2)
CO2: 26 mmol/L (ref 20–29)
Calcium: 9.9 mg/dL (ref 8.7–10.2)
Chloride: 106 mmol/L (ref 96–106)
Creatinine, Ser: 1.03 mg/dL (ref 0.76–1.27)
Globulin, Total: 2 g/dL (ref 1.5–4.5)
Glucose: 152 mg/dL — ABNORMAL HIGH (ref 70–99)
Potassium: 4.2 mmol/L (ref 3.5–5.2)
Sodium: 147 mmol/L — ABNORMAL HIGH (ref 134–144)
Total Protein: 6.5 g/dL (ref 6.0–8.5)
eGFR: 84 mL/min/{1.73_m2} (ref >59)

## 2022-04-16 LAB — CBC WITH DIFFERENTIAL/PLATELET
Basophils Absolute: 0.1 10*3/uL (ref 0.0–0.2)
Basos: 1 %
EOS (ABSOLUTE): 0.4 10*3/uL (ref 0.0–0.4)
Eos: 6 %
Hematocrit: 42.1 % (ref 37.5–51.0)
Hemoglobin: 14.8 g/dL (ref 13.0–17.7)
Immature Grans (Abs): 0 10*3/uL (ref 0.0–0.1)
Immature Granulocytes: 0 %
Lymphocytes Absolute: 1.8 10*3/uL (ref 0.7–3.1)
Lymphs: 25 %
MCH: 29.4 pg (ref 26.6–33.0)
MCHC: 35.2 g/dL (ref 31.5–35.7)
MCV: 84 fL (ref 79–97)
Monocytes Absolute: 0.5 10*3/uL (ref 0.1–0.9)
Monocytes: 6 %
Neutrophils Absolute: 4.5 10*3/uL (ref 1.4–7.0)
Neutrophils: 62 %
Platelets: 252 10*3/uL (ref 150–450)
RBC: 5.04 x10E6/uL (ref 4.14–5.80)
RDW: 14.1 % (ref 11.6–15.4)
WBC: 7.3 10*3/uL (ref 3.4–10.8)

## 2022-04-16 LAB — PSA, TOTAL AND FREE
PSA, Free Pct: 35.6 %
PSA, Free: 1.39 ng/mL
Prostate Specific Ag, Serum: 3.9 ng/mL (ref 0.0–4.0)

## 2022-04-17 LAB — URINE CULTURE: Organism ID, Bacteria: NO GROWTH

## 2022-04-25 ENCOUNTER — Encounter: Payer: Self-pay | Admitting: *Deleted

## 2022-07-21 ENCOUNTER — Encounter: Payer: Self-pay | Admitting: Family Medicine

## 2022-07-21 ENCOUNTER — Ambulatory Visit: Payer: No Typology Code available for payment source | Admitting: Family Medicine

## 2022-07-21 VITALS — BP 112/69 | HR 86 | Temp 98.0°F | Ht 65.0 in | Wt 184.0 lb

## 2022-07-21 DIAGNOSIS — I152 Hypertension secondary to endocrine disorders: Secondary | ICD-10-CM | POA: Diagnosis not present

## 2022-07-21 DIAGNOSIS — E1159 Type 2 diabetes mellitus with other circulatory complications: Secondary | ICD-10-CM

## 2022-07-21 DIAGNOSIS — E785 Hyperlipidemia, unspecified: Secondary | ICD-10-CM

## 2022-07-21 DIAGNOSIS — E1169 Type 2 diabetes mellitus with other specified complication: Secondary | ICD-10-CM

## 2022-07-21 LAB — BAYER DCA HB A1C WAIVED: HB A1C (BAYER DCA - WAIVED): 8 % — ABNORMAL HIGH (ref 4.8–5.6)

## 2022-07-21 MED ORDER — TRULICITY 1.5 MG/0.5ML ~~LOC~~ SOAJ: 1.5000 mg | SUBCUTANEOUS | 3 refills | Status: DC

## 2022-07-21 NOTE — Progress Notes (Signed)
BP 112/69   Pulse 86   Temp 98 F (36.7 C)   Ht 5\' 5"  (1.651 m)   Wt 184 lb (83.5 kg)   SpO2 99%   BMI 30.62 kg/m    Subjective:   Patient ID: Antonio Fisher, male    DOB: 07-05-1961, 60 y.o.   MRN: [id number]  HPI: Antonio Fisher is a 61 y.o. male presenting on 07/21/2022 for Medical Management of Chronic Issues and Diabetes   HPI Type 2 diabetes mellitus Patient comes in today for recheck of his diabetes. Patient has been currently taking Trulicity and metformin. Patient is currently on an ACE inhibitor/ARB. Patient has not seen an ophthalmologist this year. Patient denies any issues with their feet. The symptom started onset as an adult hypertension and hyperlipidemia ARE RELATED TO DM   Hypertension Patient is currently on lisinopril, and their blood pressure today is 112/69. Patient denies any lightheadedness or dizziness. Patient denies headaches, blurred vision, chest pains, shortness of breath, or weakness. Denies any side effects from medication and is content with current medication.   Hyperlipidemia Patient is coming in for recheck of his hyperlipidemia. The patient is currently taking no medicine currently. They deny any issues with myalgias or history of liver damage from it. They deny any focal numbness or weakness or chest pain.   Relevant past medical, surgical, family and social history reviewed and updated as indicated. Interim medical history since our last visit reviewed. Allergies and medications reviewed and updated.  Review of Systems  Constitutional:  Negative for chills and fever.  Respiratory:  Negative for shortness of breath and wheezing.   Cardiovascular:  Negative for chest pain and leg swelling.  Musculoskeletal:  Negative for back pain and gait problem.  Skin:  Negative for rash.  All other systems reviewed and are negative.   Per HPI unless specifically indicated above   Allergies as of 07/21/2022   No Known Allergies      Medication  List        Accurate as of July 21, 2022  3:45 PM. If you have any questions, ask your nurse or doctor.          STOP taking these medications    tamsulosin 0.4 MG Caps capsule Commonly known as: FLOMAX Stopped by: July 23, 2022 Jarett Dralle, MD   Trulicity 0.75 MG/0.5ML Sopn Generic drug: Dulaglutide Replaced by: Trulicity 1.5 MG/0.5ML Sopn Stopped by: Elige Radon Priscilla Finklea, MD       TAKE these medications    indomethacin 25 MG capsule Commonly known as: INDOCIN Take 1 capsule (25 mg total) by mouth 2 (two) times daily with a meal.   lisinopril 2.5 MG tablet Commonly known as: ZESTRIL Take 1 tablet (2.5 mg total) by mouth daily.   metFORMIN 1000 MG tablet Commonly known as: GLUCOPHAGE Take 1 tablet (1,000 mg total) by mouth 2 (two) times daily with a meal.   triamcinolone cream 0.1 % Commonly known as: KENALOG Apply 1 application topically 2 (two) times daily.   Trulicity 1.5 MG/0.5ML Sopn Generic drug: Dulaglutide Inject 1.5 mg into the skin once a week. Replaces: Trulicity 0.75 MG/0.5ML Sopn Started by: Elige Radon Yessika Otte, MD         Objective:   BP 112/69   Pulse 86   Temp 98 F (36.7 C)   Ht 5\' 5"  (1.651 m)   Wt 184 lb (83.5 kg)   SpO2 99%   BMI 30.62 kg/m   Wt Readings from Last 3  Encounters:  07/21/22 184 lb (83.5 kg)  04/15/22 192 lb (87.1 kg)  01/12/22 191 lb (86.6 kg)    Physical Exam Vitals and nursing note reviewed.  Constitutional:      General: He is not in acute distress.    Appearance: He is well-developed. He is not diaphoretic.  Eyes:     General: No scleral icterus.    Conjunctiva/sclera: Conjunctivae normal.  Neck:     Thyroid: No thyromegaly.  Cardiovascular:     Rate and Rhythm: Normal rate and regular rhythm.     Heart sounds: Normal heart sounds. No murmur heard. Pulmonary:     Effort: Pulmonary effort is normal. No respiratory distress.     Breath sounds: Normal breath sounds. No wheezing.  Musculoskeletal:         General: Normal range of motion.     Cervical back: Neck supple.  Lymphadenopathy:     Cervical: No cervical adenopathy.  Skin:    General: Skin is warm and dry.     Findings: No rash.  Neurological:     Mental Status: He is alert and oriented to person, place, and time.     Coordination: Coordination normal.  Psychiatric:        Behavior: Behavior normal.     Assessment & Plan:   Problem List Items Addressed This Visit       Cardiovascular and Mediastinum   Hypertension associated with diabetes ([facility])   Relevant Medications   Dulaglutide (TRULICITY) 1.5 MG/0.5ML SOPN     Endocrine   Type 2 diabetes mellitus ([facility]) - Primary   Relevant Medications   Dulaglutide (TRULICITY) 1.5 MG/0.5ML SOPN   Other Relevant Orders   Bayer DCA Hb A1c Waived   Hyperlipidemia associated with type 2 diabetes mellitus ([facility])   Relevant Medications   Dulaglutide (TRULICITY) 1.5 MG/0.5ML SOPN    Patient's A1c is up to 8.0.  He does admit he has been eating out more often.  We will increase his Trulicity to 1.5 and see if he tolerates. Follow up plan: Return in about 3 months (around 10/20/2022), or if symptoms worsen or fail to improve, for Diabetes and hypertension and cholesterol.  Counseling provided for all of the vaccine components Orders Placed This Encounter  Procedures   Bayer DCA Hb A1c Waived    Arville Care, MD [facility] 07/21/2022, 3:45 PM

## 2022-10-21 ENCOUNTER — Encounter: Payer: Self-pay | Admitting: Family Medicine

## 2022-10-21 ENCOUNTER — Ambulatory Visit: Payer: 59 | Admitting: Family Medicine

## 2022-10-21 VITALS — BP 153/83 | HR 56 | Temp 97.4°F | Ht 65.0 in | Wt 183.0 lb

## 2022-10-21 DIAGNOSIS — I152 Hypertension secondary to endocrine disorders: Secondary | ICD-10-CM

## 2022-10-21 DIAGNOSIS — E785 Hyperlipidemia, unspecified: Secondary | ICD-10-CM | POA: Diagnosis not present

## 2022-10-21 DIAGNOSIS — E1169 Type 2 diabetes mellitus with other specified complication: Secondary | ICD-10-CM

## 2022-10-21 DIAGNOSIS — E1159 Type 2 diabetes mellitus with other circulatory complications: Secondary | ICD-10-CM

## 2022-10-21 LAB — BAYER DCA HB A1C WAIVED: HB A1C (BAYER DCA - WAIVED): 8.6 % — ABNORMAL HIGH (ref 4.8–5.6)

## 2022-10-21 MED ORDER — EMPAGLIFLOZIN 10 MG PO TABS: 10.0000 mg | ORAL_TABLET | Freq: Every day | ORAL | 5 refills | Status: DC

## 2022-10-21 MED ORDER — TRIAMCINOLONE ACETONIDE 0.1 % EX CREA: 1.0000 | TOPICAL_CREAM | Freq: Two times a day (BID) | CUTANEOUS | 1 refills | Status: DC

## 2022-10-21 NOTE — Progress Notes (Signed)
BP (!) 153/83   Pulse (!) 56   Temp (!) 97.4 F (36.3 C)   Ht _0  (1.651 m)   Wt 183 lb (83 kg)   SpO2 98%   BMI 30.45 kg/m    Subjective:   Patient ID: Antonio Fisher, male    DOB: October 04, 1961, 61 y.o.   MRN: [id number]  HPI: Antonio Fisher is a 61 y.o. male presenting on 10/21/2022 for Medical Management of Chronic Issues and Diabetes   HPI Type 2 diabetes mellitus Patient comes in today for recheck of his diabetes. Patient has been currently taking Trulicity and metformin. Patient is currently on an ACE inhibitor/ARB. Patient has not seen an ophthalmologist this year. Patient denies any issues with their feet. The symptom started onset as an adult hypertension and hyperlipidemia ARE RELATED TO DM  Hypertension Patient is currently on lisinopril, and their blood pressure today is 153/83. Patient denies any lightheadedness or dizziness. Patient denies headaches, blurred vision, chest pains, shortness of breath, or weakness. Denies any side effects from medication and is content with current medication.  He has a lot of stress right now so that is why his blood pressure is up, his wife is going through radiation treatment for breast cancer and he recently recovered from gout over the past few days but is feeling better today  Hyperlipidemia Patient is coming in for recheck of his hyperlipidemia. The patient is currently taking no medication currently, did not tolerate it. They deny any issues with myalgias or history of liver damage from it. They deny any focal numbness or weakness or chest pain.   Relevant past medical, surgical, family and social history reviewed and updated as indicated. Interim medical history since our last visit reviewed. Allergies and medications reviewed and updated.  Review of Systems  Constitutional:  Negative for chills and fever.  Eyes:  Negative for visual disturbance.  Respiratory:  Negative for shortness of breath and wheezing.    Cardiovascular:  Negative for chest pain and leg swelling.  Musculoskeletal:  Negative for back pain and gait problem.  Skin:  Negative for rash.  Neurological:  Negative for dizziness, weakness and light-headedness.  All other systems reviewed and are negative.   Per HPI unless specifically indicated above   Allergies as of 10/21/2022   No Known Allergies      Medication List        Accurate as of October 21, 2022  4:17 PM. If you have any questions, ask your nurse or doctor.          empagliflozin 10 MG Tabs tablet Commonly known as: Jardiance Take 1 tablet (10 mg total) by mouth daily before breakfast. Started by: Fransisca Kaufmann Aurea Aronov, MD   indomethacin 25 MG capsule Commonly known as: INDOCIN Take 1 capsule (25 mg total) by mouth 2 (two) times daily with a meal.   lisinopril 2.5 MG tablet Commonly known as: ZESTRIL Take 1 tablet (2.5 mg total) by mouth daily.   metFORMIN 1000 MG tablet Commonly known as: GLUCOPHAGE Take 1 tablet (1,000 mg total) by mouth 2 (two) times daily with a meal.   triamcinolone cream 0.1 % Commonly known as: KENALOG Apply 1 Application topically 2 (two) times daily.   Trulicity 1.5 UD/1.4HF Sopn Generic drug: Dulaglutide Inject 1.5 mg into the skin once a week.         Objective:   BP (!) 153/83   Pulse (!) 56   Temp (!) 97.4 F (36.3 C)  Ht _0  (1.651 m)   Wt 183 lb (83 kg)   SpO2 98%   BMI 30.45 kg/m   Wt Readings from Last 3 Encounters:  10/21/22 183 lb (83 kg)  07/21/22 184 lb (83.5 kg)  04/15/22 192 lb (87.1 kg)    Physical Exam Vitals and nursing note reviewed.  Constitutional:      General: He is not in acute distress.    Appearance: He is well-developed. He is not diaphoretic.  Eyes:     General: No scleral icterus.    Conjunctiva/sclera: Conjunctivae normal.  Neck:     Thyroid: No thyromegaly.  Cardiovascular:     Rate and Rhythm: Normal rate and regular rhythm.     Heart sounds: Normal heart  sounds. No murmur heard. Pulmonary:     Effort: Pulmonary effort is normal. No respiratory distress.     Breath sounds: Normal breath sounds. No wheezing.  Musculoskeletal:        General: No swelling. Normal range of motion.     Cervical back: Neck supple.  Lymphadenopathy:     Cervical: No cervical adenopathy.  Skin:    General: Skin is warm and dry.     Findings: No rash.  Neurological:     Mental Status: He is alert and oriented to person, place, and time.     Coordination: Coordination normal.  Psychiatric:        Behavior: Behavior normal.       Assessment & Plan:   Problem List Items Addressed This Visit       Cardiovascular and Mediastinum   Hypertension associated with diabetes ([place])   Relevant Medications   empagliflozin (JARDIANCE) 10 MG TABS tablet   Other Relevant Orders   CBC with Differential/Platelet   CMP14+EGFR   Lipid panel   Bayer DCA Hb A1c Waived     Endocrine   Type 2 diabetes mellitus ([place]) - Primary   Relevant Medications   empagliflozin (JARDIANCE) 10 MG TABS tablet   Other Relevant Orders   CBC with Differential/Platelet   CMP14+EGFR   Lipid panel   Bayer DCA Hb A1c Waived   Microalbumin / creatinine urine ratio   Hyperlipidemia associated with type 2 diabetes mellitus ([facility])   Relevant Medications   empagliflozin (JARDIANCE) 10 MG TABS tablet   Other Relevant Orders   CBC with Differential/Platelet   CMP14+EGFR   Lipid panel   Bayer DCA Hb A1c Waived    Blood pressure elevated but likely stress from recent and also his wife's chemotherapy and dealing with that.  A1c is 8.6, will try Jardiance, gave sample for Jardiance for at least a month and a half Follow up plan: Return in about 3 months (around 01/20/2023), or if symptoms worsen or fail to improve, for Diabetes and hypertension and cholesterol.  Counseling provided for all of the vaccine components Orders Placed This Encounter  Procedures   CBC with Differential/Platelet    CMP14+EGFR   Lipid panel   Bayer DCA Hb A1c Waived   Microalbumin / creatinine urine ratio    Caryl Pina, MD [facility] 10/21/2022, 4:17 PM

## 2022-10-22 LAB — CMP14+EGFR
ALT: 14 IU/L (ref 0–44)
AST: 16 IU/L (ref 0–40)
Albumin/Globulin Ratio: 1.7 (ref 1.2–2.2)
Albumin: 4.1 g/dL (ref 3.8–4.9)
Alkaline Phosphatase: 105 IU/L (ref 44–121)
BUN/Creatinine Ratio: 16 (ref 10–24)
BUN: 16 mg/dL (ref 8–27)
Bilirubin Total: 0.3 mg/dL (ref 0.0–1.2)
CO2: 25 mmol/L (ref 20–29)
Calcium: 9.7 mg/dL (ref 8.6–10.2)
Chloride: 105 mmol/L (ref 96–106)
Creatinine, Ser: 1.01 mg/dL (ref 0.76–1.27)
Globulin, Total: 2.4 g/dL (ref 1.5–4.5)
Glucose: 222 mg/dL — ABNORMAL HIGH (ref 70–99)
Potassium: 4.4 mmol/L (ref 3.5–5.2)
Sodium: 145 mmol/L — ABNORMAL HIGH (ref 134–144)
Total Protein: 6.5 g/dL (ref 6.0–8.5)
eGFR: 85 mL/min/{1.73_m2} (ref >59)

## 2022-10-22 LAB — LIPID PANEL
Chol/HDL Ratio: 4.7 ratio (ref 0.0–5.0)
Cholesterol, Total: 186 mg/dL (ref 100–199)
HDL: 40 mg/dL (ref >39)
LDL Chol Calc (NIH): 109 mg/dL — ABNORMAL HIGH (ref 0–99)
Triglycerides: 214 mg/dL — ABNORMAL HIGH (ref 0–149)
VLDL Cholesterol Cal: 37 mg/dL (ref 5–40)

## 2022-10-22 LAB — CBC WITH DIFFERENTIAL/PLATELET
Basophils Absolute: 0.1 10*3/uL (ref 0.0–0.2)
Basos: 1 %
EOS (ABSOLUTE): 0.5 10*3/uL — ABNORMAL HIGH (ref 0.0–0.4)
Eos: 7 %
Hematocrit: 40.2 % (ref 37.5–51.0)
Hemoglobin: 13.7 g/dL (ref 13.0–17.7)
Immature Grans (Abs): 0 10*3/uL (ref 0.0–0.1)
Immature Granulocytes: 0 %
Lymphocytes Absolute: 2.1 10*3/uL (ref 0.7–3.1)
Lymphs: 27 %
MCH: 29.1 pg (ref 26.6–33.0)
MCHC: 34.1 g/dL (ref 31.5–35.7)
MCV: 85 fL (ref 79–97)
Monocytes Absolute: 0.4 10*3/uL (ref 0.1–0.9)
Monocytes: 5 %
Neutrophils Absolute: 4.6 10*3/uL (ref 1.4–7.0)
Neutrophils: 60 %
Platelets: 273 10*3/uL (ref 150–450)
RBC: 4.71 x10E6/uL (ref 4.14–5.80)
RDW: 13.1 % (ref 11.6–15.4)
WBC: 7.7 10*3/uL (ref 3.4–10.8)

## 2022-10-23 LAB — MICROALBUMIN / CREATININE URINE RATIO
Creatinine, Urine: 178.5 mg/dL
Microalb/Creat Ratio: 876 mg/g creat — ABNORMAL HIGH (ref 0–29)
Microalbumin, Urine: 1563.7 ug/mL

## 2022-11-25 ENCOUNTER — Telehealth: Payer: Self-pay | Admitting: Family Medicine

## 2022-11-25 NOTE — Telephone Encounter (Signed)
Lmtcb, Rx was sent to pharmacy at visit on 10/21/22

## 2022-11-25 NOTE — Telephone Encounter (Signed)
  Prescription Request  11/25/2022  Is this a "Controlled Substance" medicine? no  Have you seen your PCP in the last 2 weeks? I month ago  If YES, route message to pool  -  If NO, patient needs to be scheduled for appointment.  What is the name of the medication or equipment? Pt finished jardiance samples   Have you contacted your pharmacy to request a refill? no   Which pharmacy would you like this sent to? [facility]   Patient notified that their request is being sent to the clinical staff for review and that they should receive a response within 2 business days.

## 2022-12-15 ENCOUNTER — Other Ambulatory Visit: Payer: Self-pay | Admitting: Family Medicine

## 2022-12-16 NOTE — Telephone Encounter (Signed)
Last office visit 10/21/22 Last refill 10/21/22, 454 grams, 1 refill

## 2023-01-23 ENCOUNTER — Encounter: Payer: Self-pay | Admitting: Family Medicine

## 2023-01-23 ENCOUNTER — Ambulatory Visit: Payer: 59 | Admitting: Family Medicine

## 2023-01-23 VITALS — BP 127/78 | HR 84 | Ht 65.0 in | Wt 181.0 lb

## 2023-01-23 DIAGNOSIS — E1169 Type 2 diabetes mellitus with other specified complication: Secondary | ICD-10-CM

## 2023-01-23 DIAGNOSIS — I152 Hypertension secondary to endocrine disorders: Secondary | ICD-10-CM | POA: Diagnosis not present

## 2023-01-23 DIAGNOSIS — E785 Hyperlipidemia, unspecified: Secondary | ICD-10-CM

## 2023-01-23 DIAGNOSIS — E1159 Type 2 diabetes mellitus with other circulatory complications: Secondary | ICD-10-CM

## 2023-01-23 LAB — BAYER DCA HB A1C WAIVED: HB A1C (BAYER DCA - WAIVED): 7.5 % — ABNORMAL HIGH (ref 4.8–5.6)

## 2023-01-23 MED ORDER — TRULICITY 3 MG/0.5ML ~~LOC~~ SOAJ: 3.0000 mg | SUBCUTANEOUS | 3 refills | Status: DC

## 2023-01-23 NOTE — Progress Notes (Signed)
BP 127/78   Pulse 84   Ht '5\' 5"'$  (1.651 m)   Wt 82.1 kg   SpO2 97%   BMI 30.12 kg/m    Subjective:   Patient ID: Antonio Fisher, male    DOB: 08/19/1961, 62 y.o.   MRN: [id number]  HPI: Antonio Fisher is a 63 y.o. male presenting on 01/23/2023 for Medical Management of Chronic Issues, Hypertension, and Diabetes  Type 2 diabetes mellitus Patient comes in today for recheck of their Type 2 Diabetes Mellitus. Patient's current medications include Trulicity and Metformin. Patient was given Jardiance samples but ran out 2 months ago. Patient states he has taken his Trulicity injection every week, but went to pick up his most recent injection for this coming Sunday and [facility] [place] did not have any 1.5 mg doses in stock. Patient is currently taking an ACE inhibitor/ARB. Patient has not seen an ophthalmologist this year. Patient's last ophthalmology exam was 5 months ago. Patient debues any issues with their feet. The symptom started onset as an adult. Hypertension and hyperlipidemia are related to Type 2 Diabetes Mellitus.    Hypertension Patient is coming in for a recheck of their hypertension. Patient is currently taking Lisinopril. Patient's blood pressure today is 127/78. Patient has not been taking their blood pressure at home. Patient denies lightheadedness, dizziness, weakness, headaches, blurred vision, chest pains, and shortness of breath. Patient denies any side effects from medication(s) and is content with current medication(s).   Hyperlipidemia Patient is coming in for recheck of his hyperlipidemia. The patient is not currently taking any medications and is using only lifestyle changes.  Relevant past medical, surgical, family and social history were reviewed and updated as indicated. Interim medical history were reviewed. Allergies and medications were reviewed and updated.  Review of Systems  Constitutional:  Negative for chills and fever.  Respiratory:  Negative for chest  tightness and shortness of breath.   Cardiovascular:  Negative for chest pain, palpitations and leg swelling.  Gastrointestinal:  Negative for abdominal pain, nausea and vomiting.  Musculoskeletal:  Negative for gait problem and myalgias.  Neurological:  Negative for dizziness, weakness, light-headedness, numbness and headaches.  Psychiatric/Behavioral:  Negative for sleep disturbance.   All other systems reviewed and are negative.   Per HPI unless specifically indicated above.   Allergies as of 01/23/2023   No Known Allergies      Medication List        Accurate as of January 23, 2023  4:39 PM. If you have any questions, ask your nurse or doctor.          STOP taking these medications    empagliflozin 10 MG Tabs tablet Commonly known as: Jardiance Stopped by: Fransisca Kaufmann Jackelynn Hosie, MD   Trulicity 1.5 0000000 Sopn Generic drug: Dulaglutide Replaced by: Trulicity 3 0000000 Sopn Stopped by: Fransisca Kaufmann Yulitza Shorts, MD       TAKE these medications    indomethacin 25 MG capsule Commonly known as: INDOCIN Take 1 capsule (25 mg total) by mouth 2 (two) times daily with a meal.   lisinopril 2.5 MG tablet Commonly known as: ZESTRIL Take 1 tablet (2.5 mg total) by mouth daily.   metFORMIN 1000 MG tablet Commonly known as: GLUCOPHAGE Take 1 tablet (1,000 mg total) by mouth 2 (two) times daily with a meal.   triamcinolone cream 0.1 % Commonly known as: KENALOG APPLY TOPICALLY TWICE DAILY   Trulicity 3 0000000 Sopn Generic drug: Dulaglutide Inject 3 mg as directed once a  week. Replaces: Trulicity 1.5 0000000 Sopn Started by: Fransisca Kaufmann Kristena Wilhelmi, MD         Objective:   BP 127/78   Pulse 84   Ht 5\' 5"  (1.651 m)   Wt 82.1 kg   SpO2 97%   BMI 30.12 kg/m   Wt Readings from Last 3 Encounters:  01/23/23 82.1 kg  10/21/22 83 kg  07/21/22 83.5 kg    Physical Exam Vitals reviewed.  Constitutional:      Appearance: Normal appearance.  Neck:     Thyroid: No  thyromegaly.  Cardiovascular:     Rate and Rhythm: Normal rate and regular rhythm.     Heart sounds: Normal heart sounds.  Pulmonary:     Effort: Pulmonary effort is normal.     Breath sounds: Normal breath sounds.  Abdominal:     Tenderness: There is no right CVA tenderness or left CVA tenderness.  Musculoskeletal:     Cervical back: Neck supple. No tenderness.     Right lower leg: No edema.     Left lower leg: No edema.  Skin:    General: Skin is warm and dry.  Neurological:     Mental Status: He is alert and oriented to person, place, and time.     Sensory: No sensory deficit.     Gait: Gait normal.  Psychiatric:        Mood and Affect: Mood normal.        Behavior: Behavior normal.     Results for orders placed or performed in visit on 01/23/23  Bayer DCA Hb A1c Waived  Result Value Ref Range   HB A1C (BAYER DCA - WAIVED) 7.5 (H) 4.8 - 5.6 %    Assessment & Plan:   Problem List Items Addressed This Visit       Cardiovascular and Mediastinum   Hypertension associated with diabetes ([place])   Relevant Medications   Dulaglutide (TRULICITY) 3 0000000 SOPN   Other Relevant Orders   BMP8+EGFR (Completed)   Bayer DCA Hb A1c Waived (Completed)     Endocrine   Type 2 diabetes mellitus ([place]) - Primary   Relevant Medications   Dulaglutide (TRULICITY) 3 0000000 SOPN   Other Relevant Orders   BMP8+EGFR (Completed)   Bayer DCA Hb A1c Waived (Completed)   Hyperlipidemia associated with type 2 diabetes mellitus (HCC)   Relevant Medications   Dulaglutide (TRULICITY) 3 0000000 SOPN   Other Relevant Orders   BMP8+EGFR (Completed)   Bayer DCA Hb A1c Waived (Completed)    A1c is 7.5 today. Patient instructed to continue using Metformin. Dulaglutide (Trulicity) dose will be increased from 1.5 mg per week to 3.0 mg per week. Vania Rea will be discontinued. Patient instructed to call preceptor if GI side effects of medication are intolerable.  Continue all other  medications.  Follow up plan: Return in about 3 months (around 04/25/2023), or if symptoms worsen or fail to improve, for Diabetes recheck.  Counseling provided for all of the vaccine components Orders Placed This Encounter  Procedures   BMP8+EGFR   Bayer [facility] Hb A1c [facility], PA-S2 [place] 01/23/2023, 4:39 PM  I was personally present for all components of the history, physical exam and/or medical decision making.  I agree with the documentation performed by the PA student and agree with assessment and plan above.  PA student  Vonna Kotyk [facility] 01/23/2023, 4:39 PM

## 2023-01-24 LAB — BMP8+EGFR
BUN/Creatinine Ratio: 15 (ref 10–24)
BUN: 14 mg/dL (ref 8–27)
CO2: 22 mmol/L (ref 20–29)
Calcium: 9.1 mg/dL (ref 8.6–10.2)
Chloride: 105 mmol/L (ref 96–106)
Creatinine, Ser: 0.93 mg/dL (ref 0.76–1.27)
Glucose: 154 mg/dL — ABNORMAL HIGH (ref 70–99)
Potassium: 4.1 mmol/L (ref 3.5–5.2)
Sodium: 142 mmol/L (ref 134–144)
eGFR: 94 mL/min/{1.73_m2} (ref >59)

## 2023-03-19 ENCOUNTER — Other Ambulatory Visit: Payer: Self-pay | Admitting: Family Medicine

## 2023-03-19 DIAGNOSIS — E1169 Type 2 diabetes mellitus with other specified complication: Secondary | ICD-10-CM

## 2023-03-19 DIAGNOSIS — I152 Hypertension secondary to endocrine disorders: Secondary | ICD-10-CM

## 2023-03-24 ENCOUNTER — Telehealth: Payer: Self-pay | Admitting: Family Medicine

## 2023-03-24 DIAGNOSIS — E1169 Type 2 diabetes mellitus with other specified complication: Secondary | ICD-10-CM

## 2023-03-24 MED ORDER — TRULICITY 3 MG/0.5ML ~~LOC~~ SOAJ: 3.0000 mg | SUBCUTANEOUS | 3 refills | Status: DC

## 2023-03-24 NOTE — Telephone Encounter (Signed)
Trulicity sent to [facility]. Left message making pt aware.

## 2023-04-26 ENCOUNTER — Ambulatory Visit: Payer: 59 | Admitting: Family Medicine

## 2023-04-26 ENCOUNTER — Encounter: Payer: Self-pay | Admitting: Family Medicine

## 2023-04-26 VITALS — BP 139/73 | HR 74 | Ht 65.0 in | Wt 175.0 lb

## 2023-04-26 DIAGNOSIS — E1169 Type 2 diabetes mellitus with other specified complication: Secondary | ICD-10-CM

## 2023-04-26 DIAGNOSIS — E785 Hyperlipidemia, unspecified: Secondary | ICD-10-CM

## 2023-04-26 DIAGNOSIS — M791 Myalgia, unspecified site: Secondary | ICD-10-CM

## 2023-04-26 DIAGNOSIS — I152 Hypertension secondary to endocrine disorders: Secondary | ICD-10-CM | POA: Diagnosis not present

## 2023-04-26 DIAGNOSIS — E1159 Type 2 diabetes mellitus with other circulatory complications: Secondary | ICD-10-CM | POA: Diagnosis not present

## 2023-04-26 DIAGNOSIS — T466X5A Adverse effect of antihyperlipidemic and antiarteriosclerotic drugs, initial encounter: Secondary | ICD-10-CM

## 2023-04-26 DIAGNOSIS — Z7984 Long term (current) use of oral hypoglycemic drugs: Secondary | ICD-10-CM

## 2023-04-26 LAB — BAYER DCA HB A1C WAIVED: HB A1C (BAYER DCA - WAIVED): 7.6 % — ABNORMAL HIGH (ref 4.8–5.6)

## 2023-04-26 MED ORDER — TRULICITY 3 MG/0.5ML ~~LOC~~ SOAJ: 3.0000 mg | SUBCUTANEOUS | 3 refills | Status: DC

## 2023-04-26 NOTE — Progress Notes (Signed)
BP 139/73   Pulse 74   Ht 5\' 5"  (1.651 m)   Wt 175 lb (79.4 kg)   SpO2 98%   BMI 29.12 kg/m    Subjective:   Patient ID: Antonio Fisher, male    DOB: 01/12/1961, 62 y.o.   MRN: [id number]  HPI: Antonio Fisher is a 62 y.o. male presenting on 04/26/2023 for Medical Management of Chronic Issues, Diabetes, and Hypertension   HPI Type 2 diabetes mellitus Patient comes in today for recheck of his diabetes. Patient has been currently taking Trulicity and metformin. Patient is currently on an ACE inhibitor/ARB. Patient has not seen an ophthalmologist this year. Patient denies any new issues with their feet. The symptom started onset as an adult hypertension and hyperlipidemia ARE RELATED TO DM   Hypertension Patient is currently on lisinopril, and their blood pressure today is 139/73. Patient denies any lightheadedness or dizziness. Patient denies headaches, blurred vision, chest pains, shortness of breath, or weakness. Denies any side effects from medication and is content with current medication.   Hyperlipidemia Patient is coming in for recheck of his hyperlipidemia. The patient is currently taking no medicine currently, has been resistant towards cholesterol medicines. They deny any issues with myalgias or history of liver damage from it. They deny any focal numbness or weakness or chest pain.   Relevant past medical, surgical, family and social history reviewed and updated as indicated. Interim medical history since our last visit reviewed. Allergies and medications reviewed and updated.  Review of Systems  Constitutional:  Negative for chills and fever.  Eyes:  Negative for visual disturbance.  Respiratory:  Negative for shortness of breath and wheezing.   Cardiovascular:  Negative for chest pain and leg swelling.  Musculoskeletal:  Negative for back pain and gait problem.  Skin:  Negative for rash.  All other systems reviewed and are negative.   Per HPI unless specifically  indicated above   Allergies as of 04/26/2023   No Known Allergies      Medication List        Accurate as of April 26, 2023  3:36 PM. If you have any questions, ask your nurse or doctor.          indomethacin 25 MG capsule Commonly known as: INDOCIN Take 1 capsule (25 mg total) by mouth 2 (two) times daily with a meal.   lisinopril 2.5 MG tablet Commonly known as: ZESTRIL Take 1 tablet (2.5 mg total) by mouth daily. (NEEDS TO BE SEEN BEFORE NEXT REFILL)   metFORMIN 1000 MG tablet Commonly known as: GLUCOPHAGE TAKE 1 TABLET BY MOUTH TWICE DAILY WITH A MEAL . APPOINTMENT REQUIRED FOR FUTURE REFILLS   triamcinolone cream 0.1 % Commonly known as: KENALOG APPLY TOPICALLY TWICE DAILY   Trulicity 3 MG/0.5ML Sopn Generic drug: Dulaglutide Inject 3 mg as directed once a week.         Objective:   BP 139/73   Pulse 74   Ht 5\' 5"  (1.651 m)   Wt 175 lb (79.4 kg)   SpO2 98%   BMI 29.12 kg/m   Wt Readings from Last 3 Encounters:  04/26/23 175 lb (79.4 kg)  01/23/23 181 lb (82.1 kg)  10/21/22 183 lb (83 kg)    Physical Exam Vitals and nursing note reviewed.  Constitutional:      General: He is not in acute distress.    Appearance: He is well-developed. He is not diaphoretic.  Eyes:     General: No  scleral icterus.    Conjunctiva/sclera: Conjunctivae normal.  Neck:     Thyroid: No thyromegaly.  Cardiovascular:     Rate and Rhythm: Normal rate and regular rhythm.     Heart sounds: Normal heart sounds. No murmur heard. Pulmonary:     Effort: Pulmonary effort is normal. No respiratory distress.     Breath sounds: Normal breath sounds. No wheezing.  Musculoskeletal:        General: No swelling. Normal range of motion.     Cervical back: Neck supple.  Lymphadenopathy:     Cervical: No cervical adenopathy.  Skin:    General: Skin is warm and dry.     Findings: No rash.  Neurological:     Mental Status: He is alert and oriented to person, place, and time.      Coordination: Coordination normal.  Psychiatric:        Behavior: Behavior normal.       Assessment & Plan:   Problem List Items Addressed This Visit       Cardiovascular and Mediastinum   Hypertension associated with diabetes (HCC)   Relevant Medications   Dulaglutide (TRULICITY) 3 MG/0.5ML SOPN   Other Relevant Orders   CBC with Differential/Platelet   CMP14+EGFR   Lipid panel   Bayer DCA Hb A1c Waived     Endocrine   Type 2 diabetes mellitus (HCC) - Primary   Relevant Medications   Dulaglutide (TRULICITY) 3 MG/0.5ML SOPN   Other Relevant Orders   CBC with Differential/Platelet   CMP14+EGFR   Lipid panel   Bayer DCA Hb A1c Waived   Hyperlipidemia associated with type 2 diabetes mellitus (HCC)   Relevant Medications   Dulaglutide (TRULICITY) 3 MG/0.5ML SOPN   Other Relevant Orders   CBC with Differential/Platelet   CMP14+EGFR   Lipid panel   Bayer DCA Hb A1c Waived   Other Visit Diagnoses     Myalgia due to statin         A1c 7.6, will increase his Trulicity to 3, he says he never got the 3 mg before because it was out of stock and has been taking the 1.5 mg..  Continue to focus on diet. Blood pressure everything else looks good, no changes other than that.  Follow up plan: Return in about 3 months (around 07/27/2023), or if symptoms worsen or fail to improve, for Diabetes and hypertension and cholesterol recheck.  Counseling provided for all of the vaccine components Orders Placed This Encounter  Procedures   CBC with Differential/Platelet   CMP14+EGFR   Lipid panel   Bayer DCA Hb A1c Waived    Arville Care, MD [facility] 04/26/2023, 3:36 PM

## 2023-04-27 LAB — CBC WITH DIFFERENTIAL/PLATELET
Basophils Absolute: 0.1 10*3/uL (ref 0.0–0.2)
Basos: 1 %
EOS (ABSOLUTE): 0.2 10*3/uL (ref 0.0–0.4)
Eos: 2 %
Hematocrit: 43.9 % (ref 37.5–51.0)
Hemoglobin: 14.5 g/dL (ref 13.0–17.7)
Immature Grans (Abs): 0 10*3/uL (ref 0.0–0.1)
Immature Granulocytes: 0 %
Lymphocytes Absolute: 1.7 10*3/uL (ref 0.7–3.1)
Lymphs: 23 %
MCH: 28 pg (ref 26.6–33.0)
MCHC: 33 g/dL (ref 31.5–35.7)
MCV: 85 fL (ref 79–97)
Monocytes Absolute: 0.5 10*3/uL (ref 0.1–0.9)
Monocytes: 6 %
Neutrophils Absolute: 5.1 10*3/uL (ref 1.4–7.0)
Neutrophils: 68 %
Platelets: 272 10*3/uL (ref 150–450)
RBC: 5.18 x10E6/uL (ref 4.14–5.80)
RDW: 13.7 % (ref 11.6–15.4)
WBC: 7.5 10*3/uL (ref 3.4–10.8)

## 2023-04-27 LAB — CMP14+EGFR
ALT: 19 IU/L (ref 0–44)
AST: 20 IU/L (ref 0–40)
Albumin/Globulin Ratio: 2
Albumin: 4.5 g/dL (ref 3.8–4.9)
Alkaline Phosphatase: 89 IU/L (ref 44–121)
BUN/Creatinine Ratio: 12 (ref 10–24)
BUN: 12 mg/dL (ref 8–27)
Bilirubin Total: 0.6 mg/dL (ref 0.0–1.2)
CO2: 24 mmol/L (ref 20–29)
Calcium: 9.2 mg/dL (ref 8.6–10.2)
Chloride: 104 mmol/L (ref 96–106)
Creatinine, Ser: 0.99 mg/dL (ref 0.76–1.27)
Globulin, Total: 2.3 g/dL (ref 1.5–4.5)
Glucose: 195 mg/dL — ABNORMAL HIGH (ref 70–99)
Potassium: 4.2 mmol/L (ref 3.5–5.2)
Sodium: 143 mmol/L (ref 134–144)
Total Protein: 6.8 g/dL (ref 6.0–8.5)
eGFR: 87 mL/min/{1.73_m2} (ref >59)

## 2023-04-27 LAB — LIPID PANEL
Chol/HDL Ratio: 4.7 ratio (ref 0.0–5.0)
Cholesterol, Total: 212 mg/dL — ABNORMAL HIGH (ref 100–199)
HDL: 45 mg/dL (ref >39)
LDL Chol Calc (NIH): 127 mg/dL — ABNORMAL HIGH (ref 0–99)
Triglycerides: 228 mg/dL — ABNORMAL HIGH (ref 0–149)
VLDL Cholesterol Cal: 40 mg/dL (ref 5–40)

## 2023-05-01 ENCOUNTER — Other Ambulatory Visit: Payer: Self-pay

## 2023-05-01 MED ORDER — EZETIMIBE 10 MG PO TABS: 10.0000 mg | ORAL_TABLET | Freq: Every day | ORAL | 1 refills | Status: DC

## 2023-06-13 ENCOUNTER — Other Ambulatory Visit: Payer: Self-pay | Admitting: Family Medicine

## 2023-06-13 DIAGNOSIS — E1169 Type 2 diabetes mellitus with other specified complication: Secondary | ICD-10-CM

## 2023-06-13 DIAGNOSIS — E1159 Type 2 diabetes mellitus with other circulatory complications: Secondary | ICD-10-CM

## 2023-06-18 ENCOUNTER — Other Ambulatory Visit: Payer: Self-pay | Admitting: Family Medicine

## 2023-06-18 DIAGNOSIS — E1169 Type 2 diabetes mellitus with other specified complication: Secondary | ICD-10-CM

## 2023-07-28 ENCOUNTER — Encounter: Payer: Self-pay | Admitting: Family Medicine

## 2023-07-28 ENCOUNTER — Ambulatory Visit: Payer: 59 | Admitting: Family Medicine

## 2023-07-28 VITALS — BP 128/85 | HR 75 | Ht 65.0 in | Wt 177.0 lb

## 2023-07-28 DIAGNOSIS — I152 Hypertension secondary to endocrine disorders: Secondary | ICD-10-CM | POA: Diagnosis not present

## 2023-07-28 DIAGNOSIS — E785 Hyperlipidemia, unspecified: Secondary | ICD-10-CM

## 2023-07-28 DIAGNOSIS — E1169 Type 2 diabetes mellitus with other specified complication: Secondary | ICD-10-CM

## 2023-07-28 DIAGNOSIS — Z7984 Long term (current) use of oral hypoglycemic drugs: Secondary | ICD-10-CM

## 2023-07-28 DIAGNOSIS — E1159 Type 2 diabetes mellitus with other circulatory complications: Secondary | ICD-10-CM | POA: Diagnosis not present

## 2023-07-28 LAB — BAYER DCA HB A1C WAIVED: HB A1C (BAYER DCA - WAIVED): 7.6 % — ABNORMAL HIGH (ref 4.8–5.6)

## 2023-07-28 MED ORDER — TRULICITY 4.5 MG/0.5ML ~~LOC~~ SOAJ
4.5000 mg | SUBCUTANEOUS | 3 refills | Status: DC
Start: 2023-07-28 — End: 2024-04-19

## 2023-07-28 MED ORDER — METFORMIN HCL 1000 MG PO TABS
1000.0000 mg | ORAL_TABLET | Freq: Two times a day (BID) | ORAL | 3 refills | Status: DC
Start: 2023-07-28 — End: 2024-04-19

## 2023-07-28 MED ORDER — LISINOPRIL 2.5 MG PO TABS: 2.5000 mg | ORAL_TABLET | Freq: Every day | ORAL | 3 refills | Status: DC

## 2023-07-28 NOTE — Progress Notes (Signed)
BP 128/85   Pulse 75   Ht 5\' 5"  (1.651 m)   Wt 177 lb (80.3 kg)   SpO2 98%   BMI 29.45 kg/m    Subjective:   Patient ID: Antonio Fisher, male    DOB: 1961/10/04, 62 y.o.   MRN: [id number]  HPI: Antonio Fisher is a 62 y.o. male presenting on 07/28/2023 for Medical Management of Chronic Issues and Diabetes   HPI Type 2 diabetes mellitus Patient comes in today for recheck of his diabetes. Patient has been currently taking Trulicity and metformin. Patient is currently on an ACE inhibitor/ARB. Patient has not seen an ophthalmologist this year. Patient denies any new issues with their feet. The symptom started onset as an adult hypertension and hyperlipidemia ARE RELATED TO DM   Hypertension Patient is currently on lisinopril, and their blood pressure today is 120/85. Patient denies any lightheadedness or dizziness. Patient denies headaches, blurred vision, chest pains, shortness of breath, or weakness. Denies any side effects from medication and is content with current medication.   Hyperlipidemia Patient is coming in for recheck of his hyperlipidemia. The patient is currently taking Zetia. They deny any issues with myalgias or history of liver damage from it. They deny any focal numbness or weakness or chest pain.   Relevant past medical, surgical, family and social history reviewed and updated as indicated. Interim medical history since our last visit reviewed. Allergies and medications reviewed and updated.  Review of Systems  Constitutional:  Negative for chills and fever.  Eyes:  Negative for visual disturbance.  Respiratory:  Negative for shortness of breath and wheezing.   Cardiovascular:  Negative for chest pain and leg swelling.  Musculoskeletal:  Negative for back pain and gait problem.  Skin:  Negative for rash.  Neurological:  Negative for dizziness, weakness and light-headedness.  All other systems reviewed and are negative.   Per HPI unless specifically indicated  above   Allergies as of 07/28/2023   No Known Allergies      Medication List        Accurate as of July 28, 2023  4:16 PM. If you have any questions, ask your nurse or doctor.          STOP taking these medications    Trulicity 3 MG/0.5ML Sopn Generic drug: Dulaglutide Replaced by: Trulicity 4.5 MG/0.5ML Sopn Stopped by: Elige Radon Taler Kushner       TAKE these medications    ezetimibe 10 MG tablet Commonly known as: Zetia Take 1 tablet (10 mg total) by mouth daily.   indomethacin 25 MG capsule Commonly known as: INDOCIN Take 1 capsule (25 mg total) by mouth 2 (two) times daily with a meal.   lisinopril 2.5 MG tablet Commonly known as: ZESTRIL Take 1 tablet (2.5 mg total) by mouth daily.   metFORMIN 1000 MG tablet Commonly known as: GLUCOPHAGE Take 1 tablet (1,000 mg total) by mouth 2 (two) times daily with a meal.   triamcinolone cream 0.1 % Commonly known as: KENALOG APPLY TOPICALLY TWICE DAILY   Trulicity 4.5 MG/0.5ML Sopn Generic drug: Dulaglutide Inject 4.5 mg as directed once a week. Replaces: Trulicity 3 MG/0.5ML Sopn Started by: Elige Radon Loraine Freid         Objective:   BP 128/85   Pulse 75   Ht 5\' 5"  (1.651 m)   Wt 177 lb (80.3 kg)   SpO2 98%   BMI 29.45 kg/m   Wt Readings from Last 3 Encounters:  07/28/23 177  lb (80.3 kg)  04/26/23 175 lb (79.4 kg)  01/23/23 181 lb (82.1 kg)    Physical Exam Vitals and nursing note reviewed.  Constitutional:      General: He is not in acute distress.    Appearance: He is well-developed. He is not diaphoretic.  Eyes:     General: No scleral icterus.    Conjunctiva/sclera: Conjunctivae normal.  Neck:     Thyroid: No thyromegaly.  Cardiovascular:     Rate and Rhythm: Normal rate and regular rhythm.     Heart sounds: Normal heart sounds. No murmur heard. Pulmonary:     Effort: Pulmonary effort is normal. No respiratory distress.     Breath sounds: Normal breath sounds. No wheezing.   Musculoskeletal:        General: No swelling. Normal range of motion.     Cervical back: Neck supple.  Lymphadenopathy:     Cervical: No cervical adenopathy.  Skin:    General: Skin is warm and dry.     Findings: No rash.  Neurological:     Mental Status: He is alert and oriented to person, place, and time.     Coordination: Coordination normal.  Psychiatric:        Behavior: Behavior normal.       Assessment & Plan:   Problem List Items Addressed This Visit       Cardiovascular and Mediastinum   Hypertension associated with diabetes (HCC)   Relevant Medications   lisinopril (ZESTRIL) 2.5 MG tablet   metFORMIN (GLUCOPHAGE) 1000 MG tablet   Dulaglutide (TRULICITY) 4.5 MG/0.5ML SOPN     Endocrine   Type 2 diabetes mellitus (HCC) - Primary   Relevant Medications   lisinopril (ZESTRIL) 2.5 MG tablet   metFORMIN (GLUCOPHAGE) 1000 MG tablet   Dulaglutide (TRULICITY) 4.5 MG/0.5ML SOPN   Other Relevant Orders   Bayer DCA Hb A1c Waived   Hyperlipidemia associated with type 2 diabetes mellitus (HCC)   Relevant Medications   lisinopril (ZESTRIL) 2.5 MG tablet   metFORMIN (GLUCOPHAGE) 1000 MG tablet   Dulaglutide (TRULICITY) 4.5 MG/0.5ML SOPN    A1c is 7.6, same as last time.  Will increase Trulicity to 4.5 mg  Blood pressure and everything else looks good Follow up plan: Return in about 3 months (around 10/27/2023), or if symptoms worsen or fail to improve, for Diabetes and hypertension and hyperlipidemia.  Counseling provided for all of the vaccine components Orders Placed This Encounter  Procedures   Bayer DCA Hb A1c Waived    Arville Care, MD [facility] 07/28/2023, 4:16 PM

## 2023-07-31 ENCOUNTER — Ambulatory Visit (INDEPENDENT_AMBULATORY_CARE_PROVIDER_SITE_OTHER): Payer: 59

## 2023-07-31 DIAGNOSIS — E1169 Type 2 diabetes mellitus with other specified complication: Secondary | ICD-10-CM

## 2023-07-31 LAB — HM DIABETES EYE EXAM

## 2023-07-31 NOTE — Progress Notes (Signed)
Antonio Fisher arrived 07/31/2023 and has given verbal consent to obtain images and complete their overdue diabetic retinal screening.  The images have been sent to an ophthalmologist or optometrist for review and interpretation.  Results will be sent back to Dettinger, Elige Radon, MD for review.  Patient has been informed they will be contacted when we receive the results via telephone or MyChart

## 2023-10-27 ENCOUNTER — Encounter: Payer: Self-pay | Admitting: Family Medicine

## 2023-10-27 ENCOUNTER — Ambulatory Visit: Payer: 59 | Admitting: Family Medicine

## 2023-10-27 VITALS — BP 129/78 | HR 89 | Temp 97.7°F | Ht 65.0 in | Wt 182.0 lb

## 2023-10-27 DIAGNOSIS — I152 Hypertension secondary to endocrine disorders: Secondary | ICD-10-CM | POA: Diagnosis not present

## 2023-10-27 DIAGNOSIS — E785 Hyperlipidemia, unspecified: Secondary | ICD-10-CM

## 2023-10-27 DIAGNOSIS — Z7984 Long term (current) use of oral hypoglycemic drugs: Secondary | ICD-10-CM

## 2023-10-27 DIAGNOSIS — Z125 Encounter for screening for malignant neoplasm of prostate: Secondary | ICD-10-CM | POA: Diagnosis not present

## 2023-10-27 DIAGNOSIS — E1169 Type 2 diabetes mellitus with other specified complication: Secondary | ICD-10-CM | POA: Diagnosis not present

## 2023-10-27 DIAGNOSIS — E1159 Type 2 diabetes mellitus with other circulatory complications: Secondary | ICD-10-CM | POA: Diagnosis not present

## 2023-10-27 LAB — BAYER DCA HB A1C WAIVED: HB A1C (BAYER DCA - WAIVED): 7.1 % — ABNORMAL HIGH (ref 4.8–5.6)

## 2023-10-27 LAB — LIPID PANEL

## 2023-10-27 MED ORDER — EZETIMIBE 10 MG PO TABS: 10.0000 mg | ORAL_TABLET | Freq: Every day | ORAL | 1 refills | Status: DC

## 2023-10-27 NOTE — Progress Notes (Signed)
BP 129/78   Pulse 89   Temp 97.7 F (36.5 C) (Temporal)   Ht 5\' 5"  (1.651 m)   Wt 182 lb (82.6 kg)   SpO2 95%   BMI 30.29 kg/m    Subjective:   Patient ID: Antonio Fisher, male    DOB: [birth year], 62 y.o.   MRN: [id number]  HPI: Antonio Fisher is a 62 y.o. male presenting on 10/27/2023 for Diabetes   HPI Type 2 diabetes mellitus Patient comes in today for recheck of his diabetes. Patient has been currently taking metformin. Patient is currently on an ACE inhibitor/ARB. Patient has seen an ophthalmologist this year. Patient denies any new issues with their feet. The symptom started onset as an adult hld and htn ARE RELATED TO DM   Hyperlipidemia Patient is coming in for recheck of his hyperlipidemia. The patient is currently taking Zetia. They deny any issues with myalgias or history of liver damage from it. They deny any focal numbness or weakness or chest pain.   Hypertension Patient is currently on lisinopril, and their blood pressure today is 129/78. Patient denies any lightheadedness or dizziness. Patient denies headaches, blurred vision, chest pains, shortness of breath, or weakness. Denies any side effects from medication and is content with current medication.   Relevant past medical, surgical, family and social history reviewed and updated as indicated. Interim medical history since our last visit reviewed. Allergies and medications reviewed and updated.  Review of Systems  Constitutional:  Negative for chills and fever.  Eyes:  Negative for visual disturbance.  Respiratory:  Negative for shortness of breath and wheezing.   Cardiovascular:  Negative for chest pain and leg swelling.  Musculoskeletal:  Negative for back pain and gait problem.  Skin:  Negative for rash.  Neurological:  Negative for dizziness and light-headedness.  All other systems reviewed and are negative.   Per HPI unless specifically indicated above   Allergies as of 10/27/2023   No Known  Allergies      Medication List        Accurate as of October 27, 2023  4:00 PM. If you have any questions, ask your nurse or doctor.          ezetimibe 10 MG tablet Commonly known as: Zetia Take 1 tablet (10 mg total) by mouth daily.   indomethacin 25 MG capsule Commonly known as: INDOCIN Take 1 capsule (25 mg total) by mouth 2 (two) times daily with a meal.   lisinopril 2.5 MG tablet Commonly known as: ZESTRIL Take 1 tablet (2.5 mg total) by mouth daily.   metFORMIN 1000 MG tablet Commonly known as: GLUCOPHAGE Take 1 tablet (1,000 mg total) by mouth 2 (two) times daily with a meal.   triamcinolone cream 0.1 % Commonly known as: KENALOG APPLY TOPICALLY TWICE DAILY   Trulicity 4.5 MG/0.5ML Soaj Generic drug: Dulaglutide Inject 4.5 mg as directed once a week.         Objective:   BP 129/78   Pulse 89   Temp 97.7 F (36.5 C) (Temporal)   Ht 5\' 5"  (1.651 m)   Wt 182 lb (82.6 kg)   SpO2 95%   BMI 30.29 kg/m   Wt Readings from Last 3 Encounters:  10/27/23 182 lb (82.6 kg)  07/28/23 177 lb (80.3 kg)  04/26/23 175 lb (79.4 kg)    Physical Exam Vitals and nursing note reviewed.  Constitutional:      General: He is not in acute distress.  Appearance: He is well-developed. He is not diaphoretic.  Eyes:     General: No scleral icterus.       Right eye: No discharge.     Conjunctiva/sclera: Conjunctivae normal.     Pupils: Pupils are equal, round, and reactive to light.  Neck:     Thyroid: No thyromegaly.  Cardiovascular:     Rate and Rhythm: Normal rate and regular rhythm.     Heart sounds: Normal heart sounds. No murmur heard. Pulmonary:     Effort: Pulmonary effort is normal. No respiratory distress.     Breath sounds: Normal breath sounds. No wheezing.  Musculoskeletal:        General: Normal range of motion.     Cervical back: Neck supple.  Lymphadenopathy:     Cervical: No cervical adenopathy.  Skin:    General: Skin is warm and dry.      Findings: No rash.  Neurological:     Mental Status: He is alert and oriented to person, place, and time.     Coordination: Coordination normal.  Psychiatric:        Behavior: Behavior normal.     Results for orders placed or performed in visit on 08/11/23  HM DIABETES EYE EXAM   Collection Time: 07/31/23 12:00 AM  Result Value Ref Range   HM Diabetic Eye Exam No Retinopathy No Retinopathy    Assessment & Plan:   Problem List Items Addressed This Visit       Cardiovascular and Mediastinum   Hypertension associated with diabetes (HCC)   Relevant Medications   ezetimibe (ZETIA) 10 MG tablet   Other Relevant Orders   CBC with Differential/Platelet   Comprehensive metabolic panel     Endocrine   Type 2 diabetes mellitus (HCC) - Primary   Relevant Orders   Microalbumin / creatinine urine ratio   Bayer DCA Hb A1c Waived   Hyperlipidemia associated with type 2 diabetes mellitus (HCC)   Relevant Medications   ezetimibe (ZETIA) 10 MG tablet   Other Relevant Orders   Lipid panel   Other Visit Diagnoses       Prostate cancer screening       Relevant Orders   PSA       A1c is 7.1 which is better from last time.  Continue current medicine and diet and lifestyle modification.  Blood pressure and everything looks good today as well. Follow up plan: Return in about 3 months (around 01/25/2024), or if symptoms worsen or fail to improve, for diabetes.  Counseling provided for all of the vaccine components Orders Placed This Encounter  Procedures   Microalbumin / creatinine urine ratio   Bayer DCA Hb A1c Waived   CBC with Differential/Platelet   Comprehensive metabolic panel   Lipid panel   PSA    Arville Care, MD [facility] 10/27/2023, 4:00 PM

## 2023-10-28 LAB — CBC WITH DIFFERENTIAL/PLATELET
Basophils Absolute: 0.1 10*3/uL (ref 0.0–0.2)
Basos: 1 %
EOS (ABSOLUTE): 0.4 10*3/uL (ref 0.0–0.4)
Eos: 4 %
Hematocrit: 42.5 % (ref 37.5–51.0)
Hemoglobin: 14.6 g/dL (ref 13.0–17.7)
Immature Grans (Abs): 0 10*3/uL (ref 0.0–0.1)
Immature Granulocytes: 0 %
Lymphocytes Absolute: 2 10*3/uL (ref 0.7–3.1)
Lymphs: 21 %
MCH: 29.4 pg (ref 26.6–33.0)
MCHC: 34.4 g/dL (ref 31.5–35.7)
MCV: 86 fL (ref 79–97)
Monocytes Absolute: 0.5 10*3/uL (ref 0.1–0.9)
Monocytes: 6 %
Neutrophils Absolute: 6.5 10*3/uL (ref 1.4–7.0)
Neutrophils: 68 %
Platelets: 308 10*3/uL (ref 150–450)
RBC: 4.97 x10E6/uL (ref 4.14–5.80)
RDW: 13.3 % (ref 11.6–15.4)
WBC: 9.6 10*3/uL (ref 3.4–10.8)

## 2023-10-28 LAB — LIPID PANEL
Cholesterol, Total: 196 mg/dL (ref 100–199)
HDL: 46 mg/dL (ref >39)
LDL CALC COMMENT:: 4.3 ratio (ref 0.0–5.0)
LDL Chol Calc (NIH): 95 mg/dL (ref 0–99)
Triglycerides: 328 mg/dL — ABNORMAL HIGH (ref 0–149)
VLDL Cholesterol Cal: 55 mg/dL — ABNORMAL HIGH (ref 5–40)

## 2023-10-28 LAB — COMPREHENSIVE METABOLIC PANEL
ALT: 23 IU/L (ref 0–44)
AST: 20 IU/L (ref 0–40)
Albumin: 4.4 g/dL (ref 3.9–4.9)
Alkaline Phosphatase: 100 IU/L (ref 44–121)
BUN/Creatinine Ratio: 15 (ref 10–24)
BUN: 18 mg/dL (ref 8–27)
Bilirubin Total: 0.3 mg/dL (ref 0.0–1.2)
CO2: 24 mmol/L (ref 20–29)
Calcium: 9.9 mg/dL (ref 8.6–10.2)
Chloride: 103 mmol/L (ref 96–106)
Creatinine, Ser: 1.19 mg/dL (ref 0.76–1.27)
Globulin, Total: 2.3 g/dL (ref 1.5–4.5)
Glucose: 168 mg/dL — ABNORMAL HIGH (ref 70–99)
Potassium: 4.4 mmol/L (ref 3.5–5.2)
Sodium: 143 mmol/L (ref 134–144)
Total Protein: 6.7 g/dL (ref 6.0–8.5)
eGFR: 69 mL/min/{1.73_m2} (ref >59)

## 2023-10-28 LAB — PSA: Prostate Specific Ag, Serum: 3 ng/mL (ref 0.0–4.0)

## 2023-10-29 LAB — MICROALBUMIN / CREATININE URINE RATIO
Creatinine, Urine: 206.5 mg/dL
Microalb/Creat Ratio: 529 mg/g{creat} — ABNORMAL HIGH (ref 0–29)
Microalbumin, Urine: 1092.6 ug/mL

## 2023-12-31 ENCOUNTER — Other Ambulatory Visit: Payer: Self-pay | Admitting: Family Medicine

## 2024-01-17 ENCOUNTER — Encounter: Payer: Self-pay | Admitting: Family Medicine

## 2024-01-17 ENCOUNTER — Ambulatory Visit: Admitting: Family Medicine

## 2024-01-17 VITALS — BP 146/83 | HR 78 | Ht 65.0 in | Wt 180.0 lb

## 2024-01-17 DIAGNOSIS — E785 Hyperlipidemia, unspecified: Secondary | ICD-10-CM

## 2024-01-17 DIAGNOSIS — I152 Hypertension secondary to endocrine disorders: Secondary | ICD-10-CM

## 2024-01-17 DIAGNOSIS — K4090 Unilateral inguinal hernia, without obstruction or gangrene, not specified as recurrent: Secondary | ICD-10-CM

## 2024-01-17 DIAGNOSIS — E1159 Type 2 diabetes mellitus with other circulatory complications: Secondary | ICD-10-CM

## 2024-01-17 DIAGNOSIS — Z7984 Long term (current) use of oral hypoglycemic drugs: Secondary | ICD-10-CM

## 2024-01-17 DIAGNOSIS — E1169 Type 2 diabetes mellitus with other specified complication: Secondary | ICD-10-CM | POA: Diagnosis not present

## 2024-01-17 LAB — BAYER DCA HB A1C WAIVED: HB A1C (BAYER DCA - WAIVED): 7.7 % — ABNORMAL HIGH (ref 4.8–5.6)

## 2024-01-17 NOTE — Progress Notes (Signed)
 BP (!) 146/83   Pulse 78   Ht 5\' 5"  (1.651 m)   Wt 180 lb (81.6 kg)   SpO2 98%   BMI 29.95 kg/m    Subjective:   Patient ID: Antonio Fisher, male    DOB: 12-02-1960, 63 y.o.   MRN: [id number]  HPI: Antonio Fisher is a 63 y.o. male presenting on 01/17/2024 for No chief complaint on file.   HPI Type 2 diabetes mellitus Patient comes in today for recheck of his diabetes. Patient has been currently taking metformin. Patient is currently on an ACE inhibitor/ARB. Patient has not seen an ophthalmologist this year. Patient denies any new issues with their feet. The symptom started onset as an adult hypertension and hyperlipidemia ARE RELATED TO DM   Hypertension Patient is currently on lisinopril, and their blood pressure today is 146/83. Patient denies any lightheadedness or dizziness. Patient denies headaches, blurred vision, chest pains, shortness of breath, or weakness. Denies any side effects from medication and is content with current medication.   Hyperlipidemia Patient is coming in for recheck of his hyperlipidemia. The patient is currently taking Zetia. They deny any issues with myalgias or history of liver damage from it. They deny any focal numbness or weakness or chest pain.   Left groin bulge Patient is complaining of left groin bulge.  He says whenever it pops out he has trouble walking because it causes some irritation there that he is able to rub it and push it back in and then it feels better.  He wants to get it fixed.  Relevant past medical, surgical, family and social history reviewed and updated as indicated. Interim medical history since our last visit reviewed. Allergies and medications reviewed and updated.  Review of Systems  Constitutional:  Negative for chills and fever.  Eyes:  Negative for visual disturbance.  Respiratory:  Negative for shortness of breath and wheezing.   Cardiovascular:  Negative for chest pain and leg swelling.  Musculoskeletal:   Negative for back pain and gait problem.  Skin:  Negative for rash.  All other systems reviewed and are negative.   Per HPI unless specifically indicated above   Allergies as of 01/17/2024   No Known Allergies      Medication List        Accurate as of January 17, 2024  9:50 AM. If you have any questions, ask your nurse or doctor.          ezetimibe 10 MG tablet Commonly known as: Zetia Take 1 tablet (10 mg total) by mouth daily.   indomethacin 25 MG capsule Commonly known as: INDOCIN Take 1 capsule (25 mg total) by mouth 2 (two) times daily with a meal.   lisinopril 2.5 MG tablet Commonly known as: ZESTRIL Take 1 tablet (2.5 mg total) by mouth daily.   metFORMIN 1000 MG tablet Commonly known as: GLUCOPHAGE Take 1 tablet (1,000 mg total) by mouth 2 (two) times daily with a meal.   triamcinolone cream 0.1 % Commonly known as: KENALOG APPLY TOPICALLY TWICE DAILY   Trulicity 4.5 MG/0.5ML Soaj Generic drug: Dulaglutide Inject 4.5 mg as directed once a week.         Objective:   BP (!) 146/83   Pulse 78   Ht 5\' 5"  (1.651 m)   Wt 180 lb (81.6 kg)   SpO2 98%   BMI 29.95 kg/m   Wt Readings from Last 3 Encounters:  01/17/24 180 lb (81.6 kg)  10/27/23 182  lb (82.6 kg)  07/28/23 177 lb (80.3 kg)    Physical Exam Vitals and nursing note reviewed.  Constitutional:      General: He is not in acute distress.    Appearance: He is well-developed. He is not diaphoretic.  Eyes:     General: No scleral icterus.    Conjunctiva/sclera: Conjunctivae normal.  Neck:     Thyroid: No thyromegaly.  Cardiovascular:     Rate and Rhythm: Normal rate and regular rhythm.     Heart sounds: Normal heart sounds. No murmur heard. Pulmonary:     Effort: Pulmonary effort is normal. No respiratory distress.     Breath sounds: Normal breath sounds. No wheezing.  Abdominal:     Hernia: A hernia is present. Hernia is present in the left inguinal area (reducible).  Musculoskeletal:         General: Normal range of motion.     Cervical back: Neck supple.  Lymphadenopathy:     Cervical: No cervical adenopathy.  Skin:    General: Skin is warm and dry.     Findings: No rash.  Neurological:     Mental Status: He is alert and oriented to person, place, and time.     Coordination: Coordination normal.  Psychiatric:        Behavior: Behavior normal.       Assessment & Plan:   Problem List Items Addressed This Visit       Cardiovascular and Mediastinum   Hypertension associated with diabetes (HCC)     Endocrine   Type 2 diabetes mellitus (HCC) - Primary   Relevant Orders   Bayer DCA Hb A1c Waived   Hyperlipidemia associated with type 2 diabetes mellitus (HCC)   Other Visit Diagnoses       Left inguinal hernia       Relevant Orders   Ambulatory referral to General Surgery       A1c is pending, will get on the way out.  Continue metformin and Trulicity.  No change in medication.  Blood pressure looks decent today no change. Follow up plan: Return in about 3 months (around 04/18/2024), or if symptoms worsen or fail to improve, for Diabetes.  Counseling provided for all of the vaccine components Orders Placed This Encounter  Procedures   Bayer DCA Hb A1c Waived   Ambulatory referral to General Surgery    Arville Care, MD [facility] 01/17/2024, 9:50 AM

## 2024-01-25 ENCOUNTER — Emergency Department (HOSPITAL_COMMUNITY)

## 2024-01-25 ENCOUNTER — Emergency Department (HOSPITAL_COMMUNITY)
Admission: EM | Admit: 2024-01-25 | Discharge: 2024-01-25 | Disposition: A | Discharge status: Home / Self Care | Attending: Emergency Medicine | Admitting: Emergency Medicine

## 2024-01-25 ENCOUNTER — Encounter (HOSPITAL_COMMUNITY): Payer: Self-pay | Admitting: Emergency Medicine

## 2024-01-25 ENCOUNTER — Other Ambulatory Visit: Payer: Self-pay

## 2024-01-25 DIAGNOSIS — M25561 Pain in right knee: Secondary | ICD-10-CM | POA: Insufficient documentation

## 2024-01-25 DIAGNOSIS — M2550 Pain in unspecified joint: Secondary | ICD-10-CM | POA: Insufficient documentation

## 2024-01-25 DIAGNOSIS — R2241 Localized swelling, mass and lump, right lower limb: Secondary | ICD-10-CM | POA: Diagnosis not present

## 2024-01-25 DIAGNOSIS — E119 Type 2 diabetes mellitus without complications: Secondary | ICD-10-CM | POA: Insufficient documentation

## 2024-01-25 DIAGNOSIS — R1030 Lower abdominal pain, unspecified: Secondary | ICD-10-CM | POA: Diagnosis present

## 2024-01-25 DIAGNOSIS — Z7984 Long term (current) use of oral hypoglycemic drugs: Secondary | ICD-10-CM | POA: Diagnosis not present

## 2024-01-25 DIAGNOSIS — R1032 Left lower quadrant pain: Secondary | ICD-10-CM | POA: Insufficient documentation

## 2024-01-25 DIAGNOSIS — I10 Essential (primary) hypertension: Secondary | ICD-10-CM | POA: Insufficient documentation

## 2024-01-25 DIAGNOSIS — Z79899 Other long term (current) drug therapy: Secondary | ICD-10-CM | POA: Insufficient documentation

## 2024-01-25 DIAGNOSIS — Z87891 Personal history of nicotine dependence: Secondary | ICD-10-CM | POA: Diagnosis not present

## 2024-01-25 LAB — CBC
HCT: 38.1 % — ABNORMAL LOW (ref 39.0–52.0)
Hemoglobin: 13 g/dL (ref 13.0–17.0)
MCH: 27.8 pg (ref 26.0–34.0)
MCHC: 34.1 g/dL (ref 30.0–36.0)
MCV: 81.4 fL (ref 80.0–100.0)
Platelets: 323 10*3/uL (ref 150–400)
RBC: 4.68 MIL/uL (ref 4.22–5.81)
RDW: 13.4 % (ref 11.5–15.5)
WBC: 6.2 10*3/uL (ref 4.0–10.5)
nRBC: 0 % (ref 0.0–0.2)

## 2024-01-25 LAB — COMPREHENSIVE METABOLIC PANEL
ALT: 17 U/L (ref 0–44)
AST: 21 U/L (ref 15–41)
Albumin: 3.1 g/dL — ABNORMAL LOW (ref 3.5–5.0)
Alkaline Phosphatase: 93 U/L (ref 38–126)
Anion gap: 14 (ref 5–15)
BUN: 14 mg/dL (ref 8–23)
CO2: 23 mmol/L (ref 22–32)
Calcium: 8.6 mg/dL — ABNORMAL LOW (ref 8.9–10.3)
Chloride: 102 mmol/L (ref 98–111)
Creatinine, Ser: 0.87 mg/dL (ref 0.61–1.24)
GFR, Estimated: >60 mL/min (ref >60)
Glucose, Bld: 362 mg/dL — ABNORMAL HIGH (ref 70–99)
Potassium: 4.2 mmol/L (ref 3.5–5.1)
Sodium: 139 mmol/L (ref 135–145)
Total Bilirubin: 0.4 mg/dL (ref 0.0–1.2)
Total Protein: 6.4 g/dL — ABNORMAL LOW (ref 6.5–8.1)

## 2024-01-25 LAB — SEDIMENTATION RATE: Sed Rate: 35 mm/h — ABNORMAL HIGH (ref 0–16)

## 2024-01-25 LAB — RESP PANEL BY RT-PCR (RSV, FLU A&B, COVID)  RVPGX2
Influenza A by PCR: NEGATIVE
Influenza B by PCR: NEGATIVE
Resp Syncytial Virus by PCR: NEGATIVE
SARS Coronavirus 2 by RT PCR: NEGATIVE

## 2024-01-25 LAB — BRAIN NATRIURETIC PEPTIDE: B Natriuretic Peptide: 50.9 pg/mL (ref 0.0–100.0)

## 2024-01-25 LAB — C-REACTIVE PROTEIN: CRP: 2.4 mg/dL — ABNORMAL HIGH (ref <1.0)

## 2024-01-25 MED ORDER — IOHEXOL 350 MG/ML SOLN
75.0000 mL | Freq: Once | INTRAVENOUS | Status: AC | PRN
  Administered 2024-01-25: 75 mL via INTRAVENOUS

## 2024-01-25 MED ORDER — KETOROLAC TROMETHAMINE 15 MG/ML IJ SOLN
15.0000 mg | Freq: Once | INTRAMUSCULAR | Status: AC
  Administered 2024-01-25: 15 mg via INTRAVENOUS
  Filled 2024-01-25: qty 1

## 2024-01-25 NOTE — ED Notes (Signed)
 CCMD caLLED and verified patient on cardiac telemetry

## 2024-01-25 NOTE — Discharge Instructions (Signed)
 You were seen in the emergency department for swelling in some of your joints and hernia pain.  You had x-rays and a CAT scan of your abdomen that did not show an obvious explanation for your symptoms.  Continue to use anti-inflammatory medication and ice to your joints.  Follow-up with the general surgeons as you are attempting.  I am putting in a referral to see if they can get you in sooner.  Follow-up with your primary care doctor to discuss further arthritis testing.

## 2024-01-25 NOTE — ED Triage Notes (Addendum)
 Pt reports left groin pain since last Wednesday. Seen by PCP, concern for hernia. Pt reports gradually worsening generalized body aches. Swelling noted to bilateral hands and right knee. Denies any hx of CHF or fevers. Denies any URI symptoms.

## 2024-01-25 NOTE — ED Provider Notes (Signed)
 Signout from Dr. Wallace Cullens.  63 year old male here with various complaints including pain in his hand and knee along with some left inguinal pain.  Has probable inguinal hernia that he is waiting for general surgery follow-up.  Lab work with elevated glucose elevated inflammatory markers.  Getting imaging of extremities and CT abdomen and pelvis.  Likely discharge and outpatient follow-up if no acute findings. Physical Exam  BP (!) 153/97   Pulse 63   Temp 98 F (36.7 C) (Oral)   Resp 18   SpO2 97%   Physical Exam  Procedures  Procedures  ED Course / MDM    Medical Decision Making Amount and/or Complexity of Data Reviewed Labs: ordered. Radiology: ordered.  Risk Prescription drug management.   CT and x-rays did not show any acute findings necessitating admission.  Will put a referral in for him to follow-up outpatient with general surgery.  Also recommended close follow-up with PCP so they can do further arthritis testing.  He is comfortable plan for discharge and outpatient follow-up.       Terrilee Files, MD 01/26/24 1054

## 2024-01-25 NOTE — ED Provider Notes (Signed)
 [place] EMERGENCY DEPARTMENT AT [facility] Provider Note  CSN: [id number] Arrival date & time: 01/25/24 1042  Chief Complaint(s) Groin Pain  HPI Antonio Fisher is a 63 y.o. male with past medical history as below, significant for HLD, HTN, DM2, gout who presents to the ED with complaint of hand and knee pain.   Reports intermittent hand and knee pain and swelling over the past few months, seems to worsen in the past few days to week.  Difficulty ambulating secondary to right knee pain primarily no numbness or weakness to extremities, no fevers or IV drug use.  He has some arthralgias.  No fevers or chills.  History of gout but reports this feels much different.  Reports that pain is worse in the morning and improves as the day goes on.  He was seen by PCP around a week ago, found to have left inguinal hernia that is reducible, he is pending follow-up with general surgery.  No abdominal pain, nausea or vomiting, no chest pain or dyspnea.  Having some difficulty with ambulation secondary to knee pain.  Denies trauma  Past Medical History Past Medical History:  Diagnosis Date   Diabetes mellitus without complication (HCC)    Gout    Hypertension    Patient Active Problem List   Diagnosis Date Noted   Hyperlipidemia associated with type 2 diabetes mellitus (HCC) 03/20/2017   Hypertension associated with diabetes (HCC) 09/23/2016   Rhinitis, allergic 07/31/2015   Type 2 diabetes mellitus (HCC) 11/12/2014   Home Medication(s) Prior to Admission medications   Medication Sig Start Date End Date Taking? Authorizing Provider  Dulaglutide (TRULICITY) 4.5 MG/0.5ML SOPN Inject 4.5 mg as directed once a week. 07/28/23   Dettinger, Elige Radon, MD  ezetimibe (ZETIA) 10 MG tablet Take 1 tablet (10 mg total) by mouth daily. 10/27/23   Dettinger, Elige Radon, MD  indomethacin (INDOCIN) 25 MG capsule Take 1 capsule (25 mg total) by mouth 2 (two) times daily with a meal. 01/12/22   Dettinger,  Elige Radon, MD  lisinopril (ZESTRIL) 2.5 MG tablet Take 1 tablet (2.5 mg total) by mouth daily. 07/28/23   Dettinger, Elige Radon, MD  metFORMIN (GLUCOPHAGE) 1000 MG tablet Take 1 tablet (1,000 mg total) by mouth 2 (two) times daily with a meal. 07/28/23   Dettinger, Elige Radon, MD  triamcinolone cream (KENALOG) 0.1 % APPLY TOPICALLY TWICE DAILY 01/01/24   Dettinger, Elige Radon, MD                                                                                                                                    Past Surgical History Past Surgical History:  Procedure Laterality Date   HAND SURGERY Right    Family History Family History  Problem Relation Age of Onset   Diabetes Father    Heart disease Brother    Heart attack Maternal Grandmother    Early death Maternal  Grandfather    Diabetes Paternal Grandmother     Social History Social History   Tobacco Use   Smoking status: Former   Smokeless tobacco: Current    Types: Engineer, drilling   Vaping status: Never Used  Substance Use Topics   Alcohol use: No    Alcohol/week: 0.0 standard drinks of alcohol   Drug use: No   Allergies Patient has no known allergies.  Review of Systems A thorough review of systems was obtained and all systems are negative except as noted in the HPI and PMH.   Physical Exam Vital Signs  I have reviewed the triage vital signs BP (!) 153/97   Pulse 63   Temp 98 F (36.7 C) (Oral)   Resp 18   SpO2 97%  Physical Exam Vitals and nursing note reviewed.  Constitutional:      General: He is not in acute distress.    Appearance: He is well-developed.  HENT:     Head: Normocephalic and atraumatic.     Right Ear: External ear normal.     Left Ear: External ear normal.     Mouth/Throat:     Mouth: Mucous membranes are moist.  Eyes:     General: No scleral icterus. Cardiovascular:     Rate and Rhythm: Normal rate and regular rhythm.     Pulses: Normal pulses.     Heart sounds: Normal heart sounds.   Pulmonary:     Effort: Pulmonary effort is normal. No respiratory distress.     Breath sounds: Normal breath sounds.  Abdominal:     General: Abdomen is flat.     Palpations: Abdomen is soft.     Tenderness: There is abdominal tenderness.    Musculoskeletal:     Cervical back: No rigidity.     Right lower leg: No edema.     Left lower leg: No edema.       Legs:     Comments: Swelling noted to bilateral hands and knees, nontender, no erythema  Skin:    General: Skin is warm and dry.     Capillary Refill: Capillary refill takes less than 2 seconds.  Neurological:     Mental Status: He is alert.  Psychiatric:        Mood and Affect: Mood normal.        Behavior: Behavior normal.     ED Results and Treatments Labs (all labs ordered are listed, but only abnormal results are displayed) Labs Reviewed  COMPREHENSIVE METABOLIC PANEL - Abnormal; Notable for the following components:      Result Value   Glucose, Bld 362 (*)    Calcium 8.6 (*)    Total Protein 6.4 (*)    Albumin 3.1 (*)    All other components within normal limits  CBC - Abnormal; Notable for the following components:   HCT 38.1 (*)    All other components within normal limits  SEDIMENTATION RATE - Abnormal; Notable for the following components:   Sed Rate 35 (*)    All other components within normal limits  C-REACTIVE PROTEIN - Abnormal; Notable for the following components:   CRP 2.4 (*)    All other components within normal limits  RESP PANEL BY RT-PCR (RSV, FLU A&B, COVID)  RVPGX2  BRAIN NATRIURETIC PEPTIDE  Radiology No results found.  Pertinent labs & imaging results that were available during my care of the patient were reviewed by me and considered in my medical decision making (see MDM for details).  Medications Ordered in ED Medications  ketorolac (TORADOL) 15 MG/ML  injection 15 mg (15 mg Intravenous Given 01/25/24 1228)  iohexol (OMNIPAQUE) 350 MG/ML injection 75 mL (75 mLs Intravenous Contrast Given 01/25/24 1503)                                                                                                                                     Procedures Procedures  (including critical care time)  Medical Decision Making / ED Course    Medical Decision Making:    Antonio Fisher is a 63 y.o. male with past medical history as below, significant for HLD, HTN, DM2 who presents to the ED with complaint of groin pain, extremity swelling. The complaint involves an extensive differential diagnosis and also carries with it a high risk of complications and morbidity.  Serious etiology was considered. Ddx includes but is not limited to: Osteoarthritis, rheumatoid arthritis, infectious arthritis, psoriatic arthritis, gout, etc.  Complete initial physical exam performed, notably the patient was in no distress, sitting comfortably on stretcher.    Reviewed and confirmed nursing documentation for past medical history, family history, social history.  Vital signs reviewed.       Brief summary: 63 year old male with history above here with bilateral hand and knee pain. Swelling noted to hands and knees on physical exam, his appropriate range of motion to his knees and hands, suspicion for septic arthritis is low   Seen by PCP on 01/17/2024 with left-sided groin bulge, intermittent bulging to his left groin that causes some discomfort when it "pops out."  He was given referral to general surgery for presumed left inguinal hernia   Labs reviewed, ESR and CRP are mildly elevated, no leukocytosis or fever, no overt erythema to joints that are affected. Low suspicion for septic arthritis given polyarthralgia's and stable exam.   Feeling somewhat better after toradol, imaging pending. Handoff to Dr butler pending imaging and recheck.           Additional  history obtained: -Additional history obtained from family -External records from outside source obtained and reviewed including: Chart review including previous notes, labs, imaging, consultation notes including  Recent PCP documentation, home medications, prior labs and   Lab Tests: -I ordered, reviewed, and interpreted labs.   The pertinent results include:   Labs Reviewed  COMPREHENSIVE METABOLIC PANEL - Abnormal; Notable for the following components:      Result Value   Glucose, Bld 362 (*)    Calcium 8.6 (*)    Total Protein 6.4 (*)    Albumin 3.1 (*)    All other components within normal limits  CBC - Abnormal; Notable for the following components:   HCT 38.1 (*)  All other components within normal limits  SEDIMENTATION RATE - Abnormal; Notable for the following components:   Sed Rate 35 (*)    All other components within normal limits  C-REACTIVE PROTEIN - Abnormal; Notable for the following components:   CRP 2.4 (*)    All other components within normal limits  RESP PANEL BY RT-PCR (RSV, FLU A&B, COVID)  RVPGX2  BRAIN NATRIURETIC PEPTIDE    Notable for esr/crp elev  EKG   EKG Interpretation Date/Time:  Thursday January 25 2024 12:14:22 EDT Ventricular Rate:  72 PR Interval:  184 QRS Duration:  88 QT Interval:  362 QTC Calculation: 396 R Axis:   67  Text Interpretation: Normal sinus rhythm Anteroseptal infarct , age undetermined Abnormal ECG When compared with ECG of 16-Jun-2018 13:35, PREVIOUS ECG IS PRESENT no stemi Confirmed by Tanda Rockers ([id number]) on 01/25/2024 12:53:59 PM         Imaging Studies ordered: I ordered imaging studies including CTAP, xr hand b/l knee b/l > pending    Medicines ordered and prescription drug management: Meds ordered this encounter  Medications   ketorolac (TORADOL) 15 MG/ML injection 15 mg   iohexol (OMNIPAQUE) 350 MG/ML injection 75 mL    -I have reviewed the patients home medicines and have made adjustments as  needed   Consultations Obtained: na   Cardiac Monitoring: Continuous pulse oximetry interpreted by myself, 98% on RA.    Social Determinants of Health:  Diagnosis or treatment significantly limited by social determinants of health: current smoker   Reevaluation: After the interventions noted above, I reevaluated the patient and found that they have improved  Co morbidities that complicate the patient evaluation  Past Medical History:  Diagnosis Date   Diabetes mellitus without complication (HCC)    Gout    Hypertension       Dispostion: Disposition decision including need for hospitalization was considered, and patient disposition pending at time of sign out.    Final Clinical Impression(s) / ED Diagnoses Final diagnoses:  Polyarthralgia  Left inguinal pain        Sloan Leiter, DO 01/25/24 1714

## 2024-01-25 NOTE — ED Notes (Signed)
 PEAk flow 290

## 2024-01-26 NOTE — Telephone Encounter (Unsigned)
 Copied from CRM [id number]. Topic: Clinical - Request for Lab/Test Order >> Jan 26, 2024  8:07 AM Gildardo Pounds wrote: Reason for CRM: Misty, daughter, [facility] ED yesterday, and was advised to be tested for rheumatoid arthritis. Patient was advised the orders would be in the system but nothing is showing. Callback number [phone]

## 2024-01-27 ENCOUNTER — Other Ambulatory Visit: Payer: Self-pay | Admitting: Family Medicine

## 2024-01-29 ENCOUNTER — Ambulatory Visit: Payer: 59 | Admitting: Family Medicine

## 2024-02-01 ENCOUNTER — Inpatient Hospital Stay: Admitting: Nurse Practitioner

## 2024-02-01 ENCOUNTER — Ambulatory Visit: Admitting: Family Medicine

## 2024-02-01 ENCOUNTER — Encounter: Payer: Self-pay | Admitting: Family Medicine

## 2024-02-01 VITALS — BP 128/75 | HR 80 | Temp 98.7°F | Ht 65.0 in | Wt 176.0 lb

## 2024-02-01 DIAGNOSIS — K4090 Unilateral inguinal hernia, without obstruction or gangrene, not specified as recurrent: Secondary | ICD-10-CM

## 2024-02-01 DIAGNOSIS — Z7985 Long-term (current) use of injectable non-insulin antidiabetic drugs: Secondary | ICD-10-CM | POA: Diagnosis not present

## 2024-02-01 DIAGNOSIS — E1169 Type 2 diabetes mellitus with other specified complication: Secondary | ICD-10-CM

## 2024-02-01 DIAGNOSIS — M2550 Pain in unspecified joint: Secondary | ICD-10-CM | POA: Diagnosis not present

## 2024-02-01 MED ORDER — PREDNISONE 20 MG PO TABS: 40.0000 mg | ORAL_TABLET | Freq: Every day | ORAL | 0 refills | Status: AC

## 2024-02-01 NOTE — Progress Notes (Signed)
 Subjective:  Patient ID: Antonio Fisher, male    DOB: [birth year], 63 y.o.   MRN: [id number]  Patient Care Team: Dettinger, Elige Radon, MD as PCP - General (Family Medicine)   Chief Complaint:  Follow-up (ED visit/)  HPI: Antonio Fisher is a 63 y.o. male presenting on 02/01/2024 for Follow-up (ED visit/)  HPI 1. Polyarthralgia Patient presented to ED on 01/25/24 for evaluation of multiple joint pain and hernia. He was provided toradol with minimal relief and discharged to follow up with PCP for additional labs. In ED, CRP and ESR elevated. Multiple images collected. States that about 2 weeks ago unable to get out of bed due to pain. No recent tick bites. Mostly in right knee, bilateral knees, low back, and hands. States that it is difficult to rise to standing. States that he is taking arthritis medication OTC. States that it is not helping. Reports that some days are worse than others. He is having an okay day today. Reports that he has a history of gout and has indocin for it. States that he tried taking his medication for gout when this first started, but it did not help.  Patient received follow up with General surgery for hernia. He states that he is able to reduce it when it "pops out". Reports that he continues to have pain when it "pops out". He is continuing to work and lift heavy objects, reporting "I have to work"   Relevant past medical, surgical, family, and social history reviewed and updated as indicated.  Allergies and medications reviewed and updated. Data reviewed: Chart in Epic.   Past Medical History:  Diagnosis Date   Diabetes mellitus without complication (HCC)    Gout    Hypertension     Past Surgical History:  Procedure Laterality Date   HAND SURGERY Right     Social History   Socioeconomic History   Marital status: Married    Spouse name: Not on file   Number of children: Not on file   Years of education: Not on file   Highest education level: Not  on file  Occupational History   Not on file  Tobacco Use   Smoking status: Former   Smokeless tobacco: Current    Types: Chew  Vaping Use   Vaping status: Never Used  Substance and Sexual Activity   Alcohol use: No    Alcohol/week: 0.0 standard drinks of alcohol   Drug use: No   Sexual activity: Not on file  Other Topics Concern   Not on file  Social History Narrative   Not on file   Social Drivers of Health   Financial Resource Strain: Not on file  Food Insecurity: No Food Insecurity (02/01/2024)   Hunger Vital Sign    Worried About Running Out of Food in the Last Year: Never true    Ran Out of Food in the Last Year: Never true  Recent Concern: Food Insecurity - Food Insecurity Present (02/01/2024)   Hunger Vital Sign    Worried About Running Out of Food in the Last Year: Sometimes true    Ran Out of Food in the Last Year: Sometimes true  Transportation Needs: No Transportation Needs (02/01/2024)   PRAPARE - Administrator, Civil Service (Medical): No    Lack of Transportation (Non-Medical): No  Physical Activity: Not on file  Stress: Not on file  Social Connections: Not on file  Intimate Partner Violence: Not At Risk (02/01/2024)  Humiliation, Afraid, Rape, and Kick questionnaire    Fear of Current or Ex-Partner: No    Emotionally Abused: No    Physically Abused: No    Sexually Abused: No    Outpatient Encounter Medications as of 02/01/2024  Medication Sig   Dulaglutide (TRULICITY) 4.5 MG/0.5ML SOPN Inject 4.5 mg as directed once a week.   ezetimibe (ZETIA) 10 MG tablet Take 1 tablet (10 mg total) by mouth daily.   indomethacin (INDOCIN) 25 MG capsule Take 1 capsule (25 mg total) by mouth 2 (two) times daily with a meal.   lisinopril (ZESTRIL) 2.5 MG tablet Take 1 tablet (2.5 mg total) by mouth daily.   metFORMIN (GLUCOPHAGE) 1000 MG tablet Take 1 tablet (1,000 mg total) by mouth 2 (two) times daily with a meal.   triamcinolone cream (KENALOG) 0.1 % APPLY  TOPICALLY TWICE DAILY   No facility-administered encounter medications on file as of 02/01/2024.    No Known Allergies  Review of Systems As per HPI  Objective:  BP 128/75   Pulse 80   Temp 98.7 F (37.1 C)   Ht 5\' 5"  (1.651 m)   Wt 176 lb (79.8 kg)   SpO2 96%   BMI 29.29 kg/m    Wt Readings from Last 3 Encounters:  02/01/24 176 lb (79.8 kg)  01/17/24 180 lb (81.6 kg)  10/27/23 182 lb (82.6 kg)    Physical Exam Constitutional:      General: He is awake. He is not in acute distress.    Appearance: Normal appearance. He is well-developed and well-groomed. He is not ill-appearing, toxic-appearing or diaphoretic.  Cardiovascular:     Rate and Rhythm: Normal rate and regular rhythm.     Pulses: Normal pulses.          Radial pulses are 2+ on the right side and 2+ on the left side.       Posterior tibial pulses are 2+ on the right side and 2+ on the left side.     Heart sounds: Normal heart sounds. No murmur heard.    No gallop.  Pulmonary:     Effort: Pulmonary effort is normal. No respiratory distress.     Breath sounds: Normal breath sounds. No stridor. No wheezing, rhonchi or rales.  Musculoskeletal:     Right hand: Swelling and tenderness present. No deformity, lacerations or bony tenderness. Decreased range of motion. Normal strength. Normal sensation. There is no disruption of two-point discrimination. Normal capillary refill. Normal pulse.     Left hand: Swelling and tenderness present. No deformity or lacerations. Decreased range of motion. Normal strength. Normal sensation. There is no disruption of two-point discrimination. Normal capillary refill. Normal pulse.     Cervical back: Full passive range of motion without pain and neck supple.     Lumbar back: Decreased range of motion.     Right knee: No swelling, deformity, effusion, erythema, ecchymosis, lacerations, bony tenderness or crepitus. Decreased range of motion. No tenderness. Normal alignment and normal  meniscus. Normal pulse.     Left knee: No swelling, deformity, effusion, erythema, ecchymosis, lacerations, bony tenderness or crepitus. Decreased range of motion. No tenderness. Normal alignment and normal meniscus.     Right lower leg: No edema.     Left lower leg: No edema.     Comments: Decreased ROM due to pain   Skin:    General: Skin is warm.     Capillary Refill: Capillary refill takes less than 2 seconds.  Neurological:  General: No focal deficit present.     Mental Status: He is alert, oriented to person, place, and time and easily aroused. Mental status is at baseline.     GCS: GCS eye subscore is 4. GCS verbal subscore is 5. GCS motor subscore is 6.     Motor: No weakness.  Psychiatric:        Attention and Perception: Attention and perception normal.        Mood and Affect: Mood and affect normal.        Speech: Speech normal.        Behavior: Behavior normal. Behavior is cooperative.        Thought Content: Thought content normal. Thought content does not include homicidal or suicidal ideation. Thought content does not include homicidal or suicidal plan.        Cognition and Memory: Cognition and memory normal.        Judgment: Judgment normal.     Results for orders placed or performed during the hospital encounter of 01/25/24  Resp panel by RT-PCR (RSV, Flu A&B, Covid) Anterior Nasal Swab   Collection Time: 01/25/24 11:30 AM   Specimen: Anterior Nasal Swab  Result Value Ref Range   SARS Coronavirus 2 by RT PCR NEGATIVE NEGATIVE   Influenza A by PCR NEGATIVE NEGATIVE   Influenza B by PCR NEGATIVE NEGATIVE   Resp Syncytial Virus by PCR NEGATIVE NEGATIVE  Comprehensive metabolic panel   Collection Time: 01/25/24 11:37 AM  Result Value Ref Range   Sodium 139 135 - 145 mmol/L   Potassium 4.2 3.5 - 5.1 mmol/L   Chloride 102 98 - 111 mmol/L   CO2 23 22 - 32 mmol/L   Glucose, Bld 362 (H) 70 - 99 mg/dL   BUN 14 8 - 23 mg/dL   Creatinine, Ser 1.61 0.61 - 1.24 mg/dL    Calcium 8.6 (L) 8.9 - 10.3 mg/dL   Total Protein 6.4 (L) 6.5 - 8.1 g/dL   Albumin 3.1 (L) 3.5 - 5.0 g/dL   AST 21 15 - 41 U/L   ALT 17 0 - 44 U/L   Alkaline Phosphatase 93 38 - 126 U/L   Total Bilirubin 0.4 0.0 - 1.2 mg/dL   GFR, Estimated >09 >60 mL/min   Anion gap 14 5 - 15  CBC   Collection Time: 01/25/24 11:37 AM  Result Value Ref Range   WBC 6.2 4.0 - 10.5 K/uL   RBC 4.68 4.22 - 5.81 MIL/uL   Hemoglobin 13.0 13.0 - 17.0 g/dL   HCT 45.4 (L) 09.8 - 11.9 %   MCV 81.4 80.0 - 100.0 fL   MCH 27.8 26.0 - 34.0 pg   MCHC 34.1 30.0 - 36.0 g/dL   RDW 14.7 82.9 - 56.2 %   Platelets 323 150 - 400 K/uL   nRBC 0.0 0.0 - 0.2 %  Brain natriuretic peptide   Collection Time: 01/25/24 11:37 AM  Result Value Ref Range   B Natriuretic Peptide 50.9 0.0 - 100.0 pg/mL  Sedimentation rate   Collection Time: 01/25/24 11:37 AM  Result Value Ref Range   Sed Rate 35 (H) 0 - 16 mm/hr  C-reactive protein   Collection Time: 01/25/24 12:30 PM  Result Value Ref Range   CRP 2.4 (H) <1.0 mg/dL       12/14/8655    8:46 PM 01/17/2024    9:18 AM 10/27/2023    3:24 PM 07/28/2023    4:06 PM 07/28/2023    4:05 PM  Depression screen PHQ 2/9  Decreased Interest 0 0 0  0  Down, Depressed, Hopeless 0 0 0  0  PHQ - 2 Score 0 0 0  0  Altered sleeping   0 0   Tired, decreased energy   0 0   Change in appetite   0 0   Feeling bad or failure about yourself    0 0   Trouble concentrating   0 0   Moving slowly or fidgety/restless   0 0   Suicidal thoughts   0 0   PHQ-9 Score   0    Difficult doing work/chores   Not difficult at all Not difficult at all        02/01/2024    3:15 PM 10/27/2023    3:24 PM 07/28/2023    4:05 PM 04/26/2023    3:26 PM  GAD 7 : Generalized Anxiety Score  Nervous, Anxious, on Edge 0 0 0 0  Control/stop worrying 0 0 0 0  Worry too much - different things 0 0 0 0  Trouble relaxing 0 0 0 0  Restless 0 0 0 0  Easily annoyed or irritable 0 0 0 0  Afraid - awful might happen 0 0  0 0  Total GAD 7 Score 0 0 0 0  Anxiety Difficulty Not difficult at all Not difficult at all Not difficult at all Not difficult at all   Pertinent labs & imaging results that were available during my care of the patient were reviewed by me and considered in my medical decision making.  Assessment & Plan:  Antonio Fisher was seen today for follow-up.  Diagnoses and all orders for this visit:  1. Polyarthralgia (Primary) Labs as below. Will communicate results to patient once available. Will await results to determine next steps.  Will send in medication as below. Reviewed most recent A1C slightly above goal. Discussed with patient to monitor BP at home. Patient to follow up with PCP.  - Uric Acid - Rhe[employer] - ANA Comprehensive Panel - predniSONE (DELTASONE) 20 MG tablet; Take 2 tablets (40 mg total) by mouth daily with breakfast for 5 days.  Dispense: 10 tablet; Refill: 0  2. Left inguinal hernia Discussed precautions and red flag symptoms. Patient established with General Surgery. Will await operation. Reviewed notes from [place], MD 01/31/24.   3. Type 2 diabetes mellitus with other specified complication, without long-term current use of insulin (HCC) As above.    Continue all other maintenance medications.  Follow up plan: Return for w/ PCP .   Continue healthy lifestyle choices, including diet (rich in fruits, vegetables, and lean proteins, and low in salt and simple carbohydrates) and exercise (at least 30 minutes of moderate physical activity daily).  Written and verbal instructions provided   The above assessment and management plan was discussed with the patient. The patient verbalized understanding of and has agreed to the management plan. Patient is aware to call the clinic if they develop any new symptoms or if symptoms persist or worsen. Patient is aware when to return to the clinic for a follow-up visit. Patient educated on when it is appropriate to go to the emergency  department.   Neale Burly, DNP-FNP [facility] [address] [place], [place] [address] [phone]

## 2024-02-09 ENCOUNTER — Ambulatory Visit: Payer: Self-pay

## 2024-02-09 ENCOUNTER — Telehealth: Admitting: Nurse Practitioner

## 2024-02-09 ENCOUNTER — Telehealth: Payer: Self-pay

## 2024-02-09 DIAGNOSIS — T7840XA Allergy, unspecified, initial encounter: Secondary | ICD-10-CM

## 2024-02-09 DIAGNOSIS — M2550 Pain in unspecified joint: Secondary | ICD-10-CM

## 2024-02-09 MED ORDER — PREDNISONE 10 MG (21) PO TBPK: ORAL_TABLET | ORAL | 0 refills | Status: DC

## 2024-02-09 MED ORDER — DICLOFENAC SODIUM 75 MG PO TBEC: 75.0000 mg | DELAYED_RELEASE_TABLET | Freq: Two times a day (BID) | ORAL | 0 refills | Status: DC

## 2024-02-09 NOTE — Telephone Encounter (Signed)
 Copied from CRM [id number]. Topic: Clinical - Lab/Test Results >> Feb 09, 2024  9:39 AM Victorino Dike T wrote: Reason for CRM: calling for results- not showing final results yet - patient needs something for pain, possible refill on Prednisone- please call wife Jennette Kettle  [phone]

## 2024-02-09 NOTE — Progress Notes (Signed)
 Virtual Visit Consent   Antonio Fisher, you are scheduled for a virtual visit with a  provider today. Just as with appointments in the office, your consent must be obtained to participate. Your consent will be active for this visit and any virtual visit you may have with one of our providers in the next 365 days. If you have a MyChart account, a copy of this consent can be sent to you electronically.  As this is a virtual visit, video technology does not allow for your provider to perform a traditional examination. This may limit your provider's ability to fully assess your condition. If your provider identifies any concerns that need to be evaluated in person or the need to arrange testing (such as labs, EKG, etc.), we will make arrangements to do so. Although advances in technology are sophisticated, we cannot ensure that it will always work on either your end or our end. If the connection with a video visit is poor, the visit may have to be switched to a telephone visit. With either a video or telephone visit, we are not always able to ensure that we have a secure connection.  By engaging in this virtual visit, you consent to the provision of healthcare and authorize for your insurance to be billed (if applicable) for the services provided during this visit. Depending on your insurance coverage, you may receive a charge related to this service.  I need to obtain your verbal consent now. Are you willing to proceed with your visit today? [name] has provided verbal consent on 02/09/2024 for a virtual visit (video or telephone). [name], FNP  Date: 02/09/2024 5:24 PM   Virtual Visit via Video Note   I, [name], connected with  [name]  ([id number], 1961-09-05) on 02/09/24 at  5:30 PM EDT by a video-enabled telemedicine application and verified that I am speaking with the correct person using two identifiers.  Location: Patient: Virtual Visit Location Patient:  Home Provider: Virtual Visit Location Provider: Home Office   I discussed the limitations of evaluation and management by telemedicine and the availability of in person appointments. The patient expressed understanding and agreed to proceed.    History of Present Illness: Antonio Fisher is a 63 y.o. who identifies as a male who was assigned male at birth, and is being seen today for itching that started immediately after taking diclofenac today   He has taken this medicine in the past (2019) without any SE  Started today at 430pm and started to experience itching all over his body without a visible rash   He has taken 2 Benadryl since that time and has had improvement   Denies any respiratory symptoms, no scratchy or sore throat denies SOB    Problems:  Patient Active Problem List   Diagnosis Date Noted   Hyperlipidemia associated with type 2 diabetes mellitus (HCC) 03/20/2017   Hypertension associated with diabetes (HCC) 09/23/2016   Rhinitis, allergic 07/31/2015   Type 2 diabetes mellitus (HCC) 11/12/2014    Allergies: No Known Allergies Medications:  Current Outpatient Medications:    diclofenac (VOLTAREN) 75 MG EC tablet, Take 1 tablet (75 mg total) by mouth 2 (two) times daily., Disp: 30 tablet, Rfl: 0   Dulaglutide (TRULICITY) 4.5 MG/0.5ML SOPN, Inject 4.5 mg as directed once a week., Disp: 6 mL, Rfl: 3   ezetimibe (ZETIA) 10 MG tablet, Take 1 tablet (10 mg total) by mouth daily., Disp: 90 tablet, Rfl: 1  lisinopril (ZESTRIL) 2.5 MG tablet, Take 1 tablet (2.5 mg total) by mouth daily., Disp: 90 tablet, Rfl: 3   metFORMIN (GLUCOPHAGE) 1000 MG tablet, Take 1 tablet (1,000 mg total) by mouth 2 (two) times daily with a meal., Disp: 180 tablet, Rfl: 3   triamcinolone cream (KENALOG) 0.1 %, APPLY TOPICALLY TWICE DAILY, Disp: 454 g, Rfl: 0  Observations/Objective: Patient is well-developed, well-nourished in no acute distress.  Resting comfortably  at home.  Head is  normocephalic, atraumatic.  No labored breathing.  Speech is clear and coherent with logical content.  Patient is alert and oriented at baseline.    Assessment and Plan:   1. Allergic reaction to drug, initial encounter  Stop Diclofenac toady and follow up with PCP regarding alternative from diclofenac   If allergy symptoms progress tonight with any SOB/respiratory or oral onset seek emergency care at ED   Meds ordered this encounter  Medications   predniSONE (STERAPRED UNI-PAK 21 TAB) 10 MG (21) TBPK tablet    Sig: Take 6 tablets on day one, 5 on day two, 4 on day three, 3 on day four, 2 on day five, and 1 on day six. Take with food.    Dispense:  21 tablet    Refill:  0      Follow Up Instructions: I discussed the assessment and treatment plan with the patient. The patient was provided an opportunity to ask questions and all were answered. The patient agreed with the plan and demonstrated an understanding of the instructions.  A copy of instructions were sent to the patient via MyChart unless otherwise noted below.    The patient was advised to call back or seek an in-person evaluation if the symptoms worsen or if the condition fails to improve as anticipated.    [name], FNP

## 2024-02-09 NOTE — Addendum Note (Signed)
 Addended by: Arville Care on: 02/09/2024 10:53 AM   Modules accepted: Orders

## 2024-02-09 NOTE — Telephone Encounter (Signed)
 Patient saw Jerrel Ivory for a hospital follow up but she is off today

## 2024-02-09 NOTE — Telephone Encounter (Signed)
  Chief Complaint: itching Symptoms: itching Frequency: constant Pertinent Negatives: Patient denies all other symptoms Disposition: [] ED /[x] Urgent Care (no appt availability in office) / [] Appointment(In office/virtual)/ []  [facility]/ [] Home Care/ [] Refused Recommended Disposition /[] [facility]/ []  Follow-up with PCP Additional Notes:   Antonio Fisher daughter calling in with concern for allergic reaction.  Antonio Fisher developed severe widespread itching within 30 minutes of 1st dose of Voltaren. No other symptoms. He has taken Voltaren in 2019 without issue.  No rash present at this time.  Took 2 Benadryl 15 minutes prior to calling in, during call he reports his is itching is decreasing. Daughter reports family has multiple allergies to medications. Requesting a change in medication. Advised evaluation within 24 hours, virtual urgent care visit scheduled for this evening. Reset password for Antonio Fisher, daughter verified access and confirms appointment is in MyChart. Educated on care advice as documented in protocol, patient verbalized understanding.   Copied from CRM [id number]. Topic: Clinical - Red Word Triage >> Feb 09, 2024  5:00 PM Antonio Fisher wrote: Red Word that prompted transfer to Nurse Triage: Patient is having an allergic reaction itching all over to a new medication diclofenac (VOLTAREN) 75 MG EC tablet Patient just took 2 benadryl Reason for Disposition  Hives or itching  Protocols used: Rash - Widespread On Drugs-A-AH

## 2024-02-09 NOTE — Telephone Encounter (Signed)
 Unfortunately most the results are not back yet so we cannot say for sure what he has.  I did send in a medicine called Voltaren or diclofenac that he can take twice a day with food to help with inflammation and pain and have him go ahead and try that.  Will await the results

## 2024-02-09 NOTE — Telephone Encounter (Signed)
 Pt's wife informed. LS

## 2024-02-11 LAB — ANA COMPREHENSIVE PANEL
Anti JO-1: <0.2 AI (ref 0.0–0.9)
Centromere Ab Screen: <0.2 AI (ref 0.0–0.9)
Chromatin Ab SerPl-aCnc: <0.2 AI (ref 0.0–0.9)
ENA RNP Ab: <0.2 AI (ref 0.0–0.9)
ENA SM Ab Ser-aCnc: <0.2 AI (ref 0.0–0.9)
ENA SSA (RO) Ab: 0.5 AI (ref 0.0–0.9)
ENA SSB (LA) Ab: <0.2 AI (ref 0.0–0.9)
Scleroderma (Scl-70) (ENA) Antibody, IgG: <0.2 AI (ref 0.0–0.9)
dsDNA Ab: 8 [IU]/mL (ref 0–9)

## 2024-02-11 LAB — URIC ACID: Uric Acid: 7.3 mg/dL (ref 3.8–8.4)

## 2024-02-11 LAB — RHEUMASSURE
14.3.3 ETA, Rheum. Arthritis: <0.2 ng/mL
CCP Antibodies IgG/IgA: <20 U
Rheumatoid Arthritis Factor: <14 U/mL

## 2024-02-12 ENCOUNTER — Encounter: Payer: Self-pay | Admitting: Family Medicine

## 2024-02-12 NOTE — Progress Notes (Signed)
 Normal/stable labs. Recommend that patient follow up with PCP if symptoms continue.

## 2024-02-13 NOTE — Progress Notes (Signed)
 Surgery orders requested via Epic inbox.

## 2024-02-15 ENCOUNTER — Encounter: Payer: Self-pay | Admitting: Family Medicine

## 2024-02-15 ENCOUNTER — Ambulatory Visit: Admitting: Family Medicine

## 2024-02-15 VITALS — BP 123/79 | HR 74 | Ht 65.0 in | Wt 172.0 lb

## 2024-02-15 DIAGNOSIS — M2550 Pain in unspecified joint: Secondary | ICD-10-CM | POA: Diagnosis not present

## 2024-02-15 MED ORDER — INDOMETHACIN 50 MG PO CAPS: 50.0000 mg | ORAL_CAPSULE | Freq: Two times a day (BID) | ORAL | 1 refills | Status: DC

## 2024-02-15 NOTE — Progress Notes (Signed)
 BP 123/79   Pulse 74   Ht 5\' 5"  (1.651 m)   Wt 172 lb (78 kg)   SpO2 97%   BMI 28.62 kg/m    Subjective:   Patient ID: Antonio Fisher, male    DOB: Apr 22, 1961, 63 y.o.   MRN: [id number]  HPI: Antonio Fisher is a 63 y.o. male presenting on 02/15/2024 for Pain all over (Prednisone is the most helpful)   HPI Patient comes in today with complaints of joint aches and pains.  Patient has been having joint aches and pains including his left shoulder and both of his knees and his left lower back as well as pain where his left groin hernia has been bothering him.  The left groin he is going to have repaired in a week.  He initially went into the emergency department on January 25, 2024 with these pains and they gave him some steroids and it got better but then it came right back.  He was then seen at her office on March 20 and got another short course of steroids which he is tapered down from and finishing and so far the only thing that is come back is his left lower back pain.  Relevant past medical, surgical, family and social history reviewed and updated as indicated. Interim medical history since our last visit reviewed. Allergies and medications reviewed and updated.  Review of Systems  Constitutional:  Negative for chills and fever.  Eyes:  Negative for discharge.  Respiratory:  Negative for shortness of breath and wheezing.   Cardiovascular:  Negative for chest pain and leg swelling.  Musculoskeletal:  Positive for arthralgias, back pain, joint swelling and myalgias. Negative for gait problem.  Skin:  Negative for rash.  All other systems reviewed and are negative.   Per HPI unless specifically indicated above   Allergies as of 02/15/2024       Reactions   Diclofenac Itching        Medication List        Accurate as of February 15, 2024  3:49 PM. If you have any questions, ask your nurse or doctor.          STOP taking these medications    diclofenac 75 MG EC  tablet Commonly known as: VOLTAREN Stopped by: Elige Radon Raymon Schlarb       TAKE these medications    ezetimibe 10 MG tablet Commonly known as: Zetia Take 1 tablet (10 mg total) by mouth daily.   indomethacin 50 MG capsule Commonly known as: INDOCIN Take 1 capsule (50 mg total) by mouth 2 (two) times daily with a meal. Started by: Elige Radon Diasha Castleman   lisinopril 2.5 MG tablet Commonly known as: ZESTRIL Take 1 tablet (2.5 mg total) by mouth daily.   metFORMIN 1000 MG tablet Commonly known as: GLUCOPHAGE Take 1 tablet (1,000 mg total) by mouth 2 (two) times daily with a meal.   predniSONE 10 MG (21) Tbpk tablet Commonly known as: STERAPRED UNI-PAK 21 TAB Take 6 tablets on day one, 5 on day two, 4 on day three, 3 on day four, 2 on day five, and 1 on day six. Take with food.   triamcinolone cream 0.1 % Commonly known as: KENALOG APPLY TOPICALLY TWICE DAILY   Trulicity 4.5 MG/0.5ML Soaj Generic drug: Dulaglutide Inject 4.5 mg as directed once a week.         Objective:   BP 123/79   Pulse 74   Ht 5\' 5"  (  1.651 m)   Wt 172 lb (78 kg)   SpO2 97%   BMI 28.62 kg/m   Wt Readings from Last 3 Encounters:  02/15/24 172 lb (78 kg)  02/01/24 176 lb (79.8 kg)  01/17/24 180 lb (81.6 kg)    Physical Exam Vitals and nursing note reviewed.  Constitutional:      General: He is not in acute distress.    Appearance: He is well-developed. He is not diaphoretic.  Eyes:     General: No scleral icterus.    Conjunctiva/sclera: Conjunctivae normal.  Neck:     Thyroid: No thyromegaly.  Cardiovascular:     Rate and Rhythm: Normal rate and regular rhythm.     Heart sounds: Normal heart sounds. No murmur heard. Pulmonary:     Effort: Pulmonary effort is normal. No respiratory distress.     Breath sounds: Normal breath sounds. No wheezing.  Musculoskeletal:        General: No swelling. Normal range of motion.     Right shoulder: Crepitus present. No tenderness or bony tenderness.  Normal range of motion.     Left shoulder: Crepitus present. No tenderness or bony tenderness. Normal range of motion.     Lumbar back: Tenderness present. No swelling, deformity or bony tenderness. Normal range of motion. Negative right straight leg raise test and negative left straight leg raise test.     Right knee: Effusion present. No erythema. Normal range of motion. No tenderness.     Left knee: Effusion present. No erythema. Normal range of motion. No tenderness.  Skin:    General: Skin is warm and dry.     Findings: No rash.  Neurological:     Mental Status: He is alert and oriented to person, place, and time.     Coordination: Coordination normal.  Psychiatric:        Behavior: Behavior normal.       Assessment & Plan:   Problem List Items Addressed This Visit   None Visit Diagnoses       Polyarthralgia    -  Primary   Relevant Medications   indomethacin (INDOCIN) 50 MG capsule   Other Relevant Orders   Ambulatory referral to Orthopedic Surgery   Rocky mtn spotted fvr abs pnl(IgG+IgM)   Lyme Disease Serology w/Reflex     Will test for Lyme and [facility] spotted fever and will also do referral to orthopedic.  Will give indomethacin that he can try instead of the prednisone.  Follow up plan: Return if symptoms worsen or fail to improve.  Counseling provided for all of the vaccine components Orders Placed This Encounter  Procedures   Rocky mtn spotted fvr abs pnl(IgG+IgM)   Lyme Disease Serology w/Reflex   Ambulatory referral to Orthopedic Surgery    Arville Care, MD [facility] 02/15/2024, 3:49 PM

## 2024-02-15 NOTE — Progress Notes (Signed)
 COVID Vaccine received:  []  No [x]  Yes Date of any COVID positive Test in last 90 days:  PCP - Arville Care, MD [phone] (Work)  [phone] (Fax)  Cardiologist - none  Chest x-ray - 01-25-2024  1v  Epic EKG -  01-25-2024  Epic Stress Test -  ECHO -  Cardiac Cath -   PCR screen: []  Ordered & Completed []   No Order but Needs PROFEND     [x]   N/A for this surgery  Surgery Plan:  [x]  Ambulatory   []  Outpatient in bed  []  Admit Anesthesia:    [x]  General  []  Spinal  []   Choice []   MAC  Bowel Prep - [x]  No  []   Yes ______  Pacemaker / ICD device [x]  No []  Yes   Spinal Cord Stimulator:[x]  No []  Yes       History of Sleep Apnea? [x]  No []  Yes   CPAP used?- [x]  No []  Yes    Does the patient monitor blood sugar?   []  N/A   []  No []  Yes  Patient has: []  NO Hx DM   []  Pre-DM   []  DM1  [x]   DM2 Last A1c was:  7.7 on   01-17-2024    Does patient have a [employer] or Dexcom? []  No []  Yes   Fasting Blood Sugar Ranges-  Checks Blood Sugar _____ times a day  Metformin:  Hold DOS Trulicity (Dulaglutide)- hold 7-10 days last dose: ???  Blood Thinner / Instructions:  none Aspirin Instructions:  none  ERAS Protocol Ordered: []  No  []  Yes PRE-SURGERY []  ENSURE  []  G2   []  No Drink Ordered  Patient is to be NPO after:    NO ORDERS  Dental hx: []  Dentures:  []  N/A      []  Bridge or Partial:                   []  Loose or Damaged teeth:   Comments:   Activity level: Patient is able / unable to climb a flight of stairs without difficulty; []  No CP  []  No SOB, but would have ___   Patient can / can not perform ADLs without assistance.   Anesthesia review: uncontrolled DM2, HTN, Polyarthralgia (recent PO steroids)  Patient denies shortness of breath, fever, cough and chest pain at PAT appointment.  Patient verbalized understanding and agreement to the Pre-Surgical Instructions that were given to them at this PAT appointment. Patient was also educated of the need to review these  PAT instructions again prior to his surgery.I reviewed the appropriate phone numbers to call if they have any and questions or concerns.

## 2024-02-15 NOTE — Patient Instructions (Signed)
 SURGICAL WAITING ROOM VISITATION Patients having surgery or a procedure may have no more than 2 support people in the waiting area - these visitors may rotate in the visitor waiting room.   If the patient needs to stay at the hospital during part of their recovery, the visitor guidelines for inpatient rooms apply.  PRE-OP VISITATION  Pre-op nurse will coordinate an appropriate time for 1 support person to accompany the patient in pre-op.  This support person may not rotate.  This visitor will be contacted when the time is appropriate for the visitor to come back in the pre-op area.  Please refer to the [facility] website for the visitor guidelines for Inpatients (after your surgery is over and you are in a regular room).  You are not required to quarantine at this time prior to your surgery. However, you must do this: Hand Hygiene often Do NOT share personal items Notify your provider if you are in close contact with someone who has COVID or you develop fever 100.4 or greater, new onset of sneezing, cough, sore throat, shortness of breath or body aches.  If you test positive for Covid or have been in contact with anyone that has tested positive in the last 10 days please notify you surgeon.    Your procedure is scheduled on:  MONDAY  March 04, 2024  Report to [facility] Main Entrance: [facility] [facility] entrance where the [employer] is available.   Report to admitting at: 11:15 AM  Call this number if you have any questions or problems the morning of surgery [phone]  FOLLOW ANY ADDITIONAL PRE OP INSTRUCTIONS YOU RECEIVED FROM YOUR SURGEON'S OFFICE!!!  Do not eat food after Midnight the night prior to your surgery/procedure.  After Midnight you may have the following liquids until  10:30  AMDAY OF SURGERY  Clear Liquid Diet Water Black Coffee (sugar ok, NO MILK/CREAM OR CREAMERS)  Tea (sugar ok, NO MILK/CREAM OR CREAMERS) regular and decaf                             Plain  Jell-O  with no fruit (NO RED)                                           Fruit ices (not with fruit pulp, NO RED)                                     Popsicles (NO RED)                                                                  Juice: NO CITRUS JUICES: only apple, WHITE grape, WHITE cranberry Sports drinks like Gatorade or Powerade (NO RED)                Oral Hygiene is also important to reduce your risk of infection.        Remember - BRUSH YOUR TEETH THE MORNING OF SURGERY WITH YOUR REGULAR TOOTHPASTE  Do NOT smoke after Midnight the  night before surgery.  STOP TAKING all Vitamins, Herbs and supplements 1 week before your surgery.   METFORMIN- Day BEFORE surgery; take as usual.  DAY OF SURGERY: DO NOT TAKE METFORMIN.  DULAGLUTIDE (Trulicity)- stop injections 7-10 days before surgery. Last injection will be on:  ?????  Take ONLY these medicines the morning of surgery with A SIP OF WATER: none                   You may not have any metal on your body including  jewelry, and body piercing  Do not wear lotions, powders,  cologne, or deodorant  Men may shave face and neck.  Contacts, Hearing Aids, dentures or bridgework may not be worn into surgery. DENTURES WILL BE REMOVED PRIOR TO SURGERY PLEASE DO NOT APPLY "Poly grip" OR ADHESIVES!!!  Patients discharged on the day of surgery will not be allowed to drive home.  Someone NEEDS to stay with you for the first 24 hours after anesthesia.  Do not bring your home medications to the hospital. The Pharmacy will dispense medications listed on your medication list to you during your admission in the Hospital.  Please read over the following fact sheets you were given: IF YOU HAVE QUESTIONS ABOUT YOUR PRE-OP INSTRUCTIONS, PLEASE CALL [phone]   [facility] - Preparing for Surgery Before surgery, you can play an important role.  Because skin is not sterile, your skin needs to be as free of germs as possible.  You can reduce the  number of germs on your skin by washing with CHG (chlorahexidine gluconate) soap before surgery.  CHG is an antiseptic cleaner which kills germs and bonds with the skin to continue killing germs even after washing. Please DO NOT use if you have an allergy to CHG or antibacterial soaps.  If your skin becomes reddened/irritated stop using the CHG and inform your nurse when you arrive at Short Stay. Do not shave (including legs and underarms) for at least 48 hours prior to the first CHG shower.  You may shave your face/neck.  Please follow these instructions carefully:  1.  Shower with CHG Soap the night before surgery and the  morning of surgery.  2.  If you choose to wash your hair, wash your hair first as usual with your normal  shampoo.  3.  After you shampoo, rinse your hair and body thoroughly to remove the shampoo.                             4.  Use CHG as you would any other liquid soap.  You can apply chg directly to the skin and wash.  Gently with a scrungie or clean washcloth.  5.  Apply the CHG Soap to your body ONLY FROM THE NECK DOWN.   Do not use on face/ open                           Wound or open sores. Avoid contact with eyes, ears mouth and genitals (private parts).                       Wash face,  Genitals (private parts) with your normal soap.             6.  Wash thoroughly, paying special attention to the area where your  surgery  will be performed.  7.  Thoroughly rinse your body with warm water from the neck down.  8.  DO NOT shower/wash with your normal soap after using and rinsing off the CHG Soap.            9.  Pat yourself dry with a clean towel.            10.  Wear clean pajamas.            11.  Place clean sheets on your bed the night of your first shower and do not  sleep with pets.  ON THE DAY OF SURGERY : Do not apply any lotions/deodorants the morning of surgery.  Please wear clean clothes to the hospital/surgery center.     FAILURE TO FOLLOW THESE  INSTRUCTIONS MAY RESULT IN THE CANCELLATION OF YOUR SURGERY  PATIENT SIGNATURE_________________________________  NURSE SIGNATURE__________________________________  ________________________________________________________________________

## 2024-02-15 NOTE — Addendum Note (Signed)
 Addended by: Dorene Sorrow on: 02/15/2024 04:01 PM   Modules accepted: Orders

## 2024-02-16 ENCOUNTER — Encounter (HOSPITAL_COMMUNITY): Payer: Self-pay

## 2024-02-16 ENCOUNTER — Other Ambulatory Visit: Payer: Self-pay

## 2024-02-16 ENCOUNTER — Encounter (HOSPITAL_COMMUNITY)
Admission: RE | Admit: 2024-02-16 | Discharge: 2024-02-16 | Disposition: A | Discharge status: Home / Self Care | Source: Ambulatory Visit | Attending: Surgery | Admitting: Surgery

## 2024-02-16 VITALS — BP 116/66 | HR 69 | Temp 98.3°F | Resp 14 | Ht 65.0 in | Wt 172.0 lb

## 2024-02-16 DIAGNOSIS — Z01818 Encounter for other preprocedural examination: Secondary | ICD-10-CM

## 2024-02-16 DIAGNOSIS — I152 Hypertension secondary to endocrine disorders: Secondary | ICD-10-CM | POA: Insufficient documentation

## 2024-02-16 DIAGNOSIS — E1169 Type 2 diabetes mellitus with other specified complication: Secondary | ICD-10-CM | POA: Insufficient documentation

## 2024-02-16 DIAGNOSIS — E1159 Type 2 diabetes mellitus with other circulatory complications: Secondary | ICD-10-CM | POA: Insufficient documentation

## 2024-02-16 DIAGNOSIS — Z01812 Encounter for preprocedural laboratory examination: Secondary | ICD-10-CM | POA: Diagnosis present

## 2024-02-16 HISTORY — DX: Personal history of urinary calculi: Z87.442

## 2024-02-16 HISTORY — DX: Unspecified osteoarthritis, unspecified site: M19.90

## 2024-02-16 LAB — BASIC METABOLIC PANEL WITH GFR
Anion gap: 11 (ref 5–15)
BUN: 19 mg/dL (ref 8–23)
CO2: 28 mmol/L (ref 22–32)
Calcium: 9.3 mg/dL (ref 8.9–10.3)
Chloride: 98 mmol/L (ref 98–111)
Creatinine, Ser: 0.97 mg/dL (ref 0.61–1.24)
GFR, Estimated: >60 mL/min (ref >60)
Glucose, Bld: 250 mg/dL — ABNORMAL HIGH (ref 70–99)
Potassium: 4.1 mmol/L (ref 3.5–5.1)
Sodium: 137 mmol/L (ref 135–145)

## 2024-02-16 LAB — SPOTTED FEVER GROUP ANTIBODIES
Spotted Fever Group IgG: <1:64 {titer}
Spotted Fever Group IgM: <1:64 {titer}

## 2024-02-16 LAB — CBC
HCT: 42.4 % (ref 39.0–52.0)
Hemoglobin: 14.1 g/dL (ref 13.0–17.0)
MCH: 28.1 pg (ref 26.0–34.0)
MCHC: 33.3 g/dL (ref 30.0–36.0)
MCV: 84.5 fL (ref 80.0–100.0)
Platelets: 278 10*3/uL (ref 150–400)
RBC: 5.02 MIL/uL (ref 4.22–5.81)
RDW: 13.2 % (ref 11.5–15.5)
WBC: 11.5 10*3/uL — ABNORMAL HIGH (ref 4.0–10.5)
nRBC: 0 % (ref 0.0–0.2)

## 2024-02-16 LAB — LYME DISEASE SEROLOGY W/REFLEX: Lyme Total Antibody EIA: NEGATIVE

## 2024-02-16 LAB — GLUCOSE, CAPILLARY: Glucose-Capillary: 257 mg/dL — ABNORMAL HIGH (ref 70–99)

## 2024-02-19 ENCOUNTER — Encounter: Payer: Self-pay | Admitting: Family Medicine

## 2024-02-19 ENCOUNTER — Ambulatory Visit: Payer: Self-pay | Admitting: Surgery

## 2024-03-04 ENCOUNTER — Ambulatory Visit (HOSPITAL_COMMUNITY)
Admission: RE | Admit: 2024-03-04 | Discharge: 2024-03-04 | Disposition: A | Discharge status: Home / Self Care | Source: Ambulatory Visit | Attending: Surgery | Admitting: Surgery

## 2024-03-04 ENCOUNTER — Encounter (HOSPITAL_COMMUNITY): Payer: Self-pay | Admitting: Surgery

## 2024-03-04 ENCOUNTER — Encounter (HOSPITAL_COMMUNITY)
Admission: RE | Disposition: A | Discharge status: Home / Self Care | Payer: Self-pay | Source: Ambulatory Visit | Attending: Surgery

## 2024-03-04 ENCOUNTER — Ambulatory Visit (HOSPITAL_COMMUNITY): Payer: Self-pay | Admitting: Physician Assistant

## 2024-03-04 ENCOUNTER — Ambulatory Visit (HOSPITAL_BASED_OUTPATIENT_CLINIC_OR_DEPARTMENT_OTHER): Payer: Self-pay | Admitting: Physician Assistant

## 2024-03-04 ENCOUNTER — Other Ambulatory Visit: Payer: Self-pay

## 2024-03-04 DIAGNOSIS — Z87891 Personal history of nicotine dependence: Secondary | ICD-10-CM | POA: Insufficient documentation

## 2024-03-04 DIAGNOSIS — E1169 Type 2 diabetes mellitus with other specified complication: Secondary | ICD-10-CM

## 2024-03-04 DIAGNOSIS — M1990 Unspecified osteoarthritis, unspecified site: Secondary | ICD-10-CM | POA: Diagnosis not present

## 2024-03-04 DIAGNOSIS — Z7984 Long term (current) use of oral hypoglycemic drugs: Secondary | ICD-10-CM | POA: Diagnosis not present

## 2024-03-04 DIAGNOSIS — E119 Type 2 diabetes mellitus without complications: Secondary | ICD-10-CM | POA: Diagnosis not present

## 2024-03-04 DIAGNOSIS — I10 Essential (primary) hypertension: Secondary | ICD-10-CM | POA: Diagnosis not present

## 2024-03-04 DIAGNOSIS — D176 Benign lipomatous neoplasm of spermatic cord: Secondary | ICD-10-CM | POA: Insufficient documentation

## 2024-03-04 DIAGNOSIS — Z01818 Encounter for other preprocedural examination: Secondary | ICD-10-CM

## 2024-03-04 DIAGNOSIS — K4090 Unilateral inguinal hernia, without obstruction or gangrene, not specified as recurrent: Secondary | ICD-10-CM | POA: Diagnosis present

## 2024-03-04 HISTORY — PX: INGUINAL HERNIA REPAIR: SHX194

## 2024-03-04 LAB — GLUCOSE, CAPILLARY
Glucose-Capillary: 204 mg/dL — ABNORMAL HIGH (ref 70–99)
Glucose-Capillary: 144 mg/dL — ABNORMAL HIGH (ref 70–99)
Glucose-Capillary: 157 mg/dL — ABNORMAL HIGH (ref 70–99)

## 2024-03-04 SURGERY — REPAIR, HERNIA, INGUINAL, ADULT
Anesthesia: General | Laterality: Left

## 2024-03-04 MED ORDER — CEFAZOLIN SODIUM-DEXTROSE 2-4 GM/100ML-% IV SOLN
2.0000 g | INTRAVENOUS | Status: AC
  Administered 2024-03-04: 2 g via INTRAVENOUS
  Filled 2024-03-04: qty 100

## 2024-03-04 MED ORDER — CHLORHEXIDINE GLUCONATE 0.12 % MT SOLN
15.0000 mL | Freq: Once | OROMUCOSAL | Status: AC
  Administered 2024-03-04: 15 mL via OROMUCOSAL

## 2024-03-04 MED ORDER — ONDANSETRON HCL 4 MG/2ML IJ SOLN
INTRAMUSCULAR | Status: AC
  Filled 2024-03-04: qty 2

## 2024-03-04 MED ORDER — BUPIVACAINE-EPINEPHRINE (PF) 0.25% -1:200000 IJ SOLN
INTRAMUSCULAR | Status: DC | PRN
  Administered 2024-03-04: 22 mL

## 2024-03-04 MED ORDER — CHLORHEXIDINE GLUCONATE CLOTH 2 % EX PADS: 6.0000 | MEDICATED_PAD | Freq: Once | CUTANEOUS | Status: DC

## 2024-03-04 MED ORDER — 0.9 % SODIUM CHLORIDE (POUR BTL) OPTIME
TOPICAL | Status: DC | PRN
  Administered 2024-03-04: 1000 mL

## 2024-03-04 MED ORDER — DEXAMETHASONE SODIUM PHOSPHATE 10 MG/ML IJ SOLN
INTRAMUSCULAR | Status: DC | PRN
  Administered 2024-03-04: 5 mg via INTRAVENOUS

## 2024-03-04 MED ORDER — PROPOFOL 10 MG/ML IV BOLUS
INTRAVENOUS | Status: AC
  Filled 2024-03-04: qty 20

## 2024-03-04 MED ORDER — FENTANYL CITRATE PF 50 MCG/ML IJ SOSY
PREFILLED_SYRINGE | INTRAMUSCULAR | Status: AC
  Filled 2024-03-04: qty 1

## 2024-03-04 MED ORDER — PHENYLEPHRINE HCL (PRESSORS) 10 MG/ML IV SOLN
INTRAVENOUS | Status: DC | PRN
  Administered 2024-03-04 (×2): 160 ug via INTRAVENOUS
  Administered 2024-03-04 (×2): 80 ug via INTRAVENOUS

## 2024-03-04 MED ORDER — ONDANSETRON HCL 4 MG/2ML IJ SOLN: 4.0000 mg | Freq: Once | INTRAMUSCULAR | Status: DC | PRN

## 2024-03-04 MED ORDER — BUPIVACAINE LIPOSOME 1.3 % IJ SUSP
INTRAMUSCULAR | Status: AC
  Filled 2024-03-04: qty 20

## 2024-03-04 MED ORDER — ONDANSETRON HCL 4 MG/2ML IJ SOLN
INTRAMUSCULAR | Status: DC | PRN
  Administered 2024-03-04: 4 mg via INTRAVENOUS

## 2024-03-04 MED ORDER — FENTANYL CITRATE (PF) 100 MCG/2ML IJ SOLN
INTRAMUSCULAR | Status: AC
  Filled 2024-03-04: qty 2

## 2024-03-04 MED ORDER — OXYCODONE HCL 5 MG PO TABS: 5.0000 mg | ORAL_TABLET | Freq: Four times a day (QID) | ORAL | 0 refills | Status: AC | PRN

## 2024-03-04 MED ORDER — LIDOCAINE HCL (CARDIAC) PF 100 MG/5ML IV SOSY
PREFILLED_SYRINGE | INTRAVENOUS | Status: DC | PRN
  Administered 2024-03-04: 80 mg via INTRAVENOUS

## 2024-03-04 MED ORDER — LACTATED RINGERS IV SOLN: INTRAVENOUS | Status: DC

## 2024-03-04 MED ORDER — INSULIN ASPART 100 UNIT/ML IJ SOLN
0.0000 [IU] | INTRAMUSCULAR | Status: DC | PRN
  Administered 2024-03-04: 4 [IU] via SUBCUTANEOUS

## 2024-03-04 MED ORDER — BUPIVACAINE-EPINEPHRINE (PF) 0.25% -1:200000 IJ SOLN
INTRAMUSCULAR | Status: AC
  Filled 2024-03-04: qty 30

## 2024-03-04 MED ORDER — FENTANYL CITRATE (PF) 100 MCG/2ML IJ SOLN
INTRAMUSCULAR | Status: DC | PRN
  Administered 2024-03-04 (×4): 50 ug via INTRAVENOUS

## 2024-03-04 MED ORDER — DEXAMETHASONE SODIUM PHOSPHATE 10 MG/ML IJ SOLN
INTRAMUSCULAR | Status: AC
  Filled 2024-03-04: qty 1

## 2024-03-04 MED ORDER — ORAL CARE MOUTH RINSE: 15.0000 mL | Freq: Once | OROMUCOSAL | Status: AC

## 2024-03-04 MED ORDER — EPHEDRINE SULFATE (PRESSORS) 50 MG/ML IJ SOLN
INTRAMUSCULAR | Status: DC | PRN
Start: 2024-03-04 — End: 2024-03-04
  Administered 2024-03-04: 5 mg via INTRAVENOUS

## 2024-03-04 MED ORDER — PROPOFOL 10 MG/ML IV BOLUS
INTRAVENOUS | Status: DC | PRN
  Administered 2024-03-04: 120 mg via INTRAVENOUS

## 2024-03-04 MED ORDER — FENTANYL CITRATE PF 50 MCG/ML IJ SOSY
25.0000 ug | PREFILLED_SYRINGE | INTRAMUSCULAR | Status: DC | PRN
  Administered 2024-03-04 (×2): 50 ug via INTRAVENOUS

## 2024-03-04 MED ORDER — BUPIVACAINE LIPOSOME 1.3 % IJ SUSP
INTRAMUSCULAR | Status: DC | PRN
  Administered 2024-03-04: 8 mL

## 2024-03-04 MED ORDER — LIDOCAINE HCL (PF) 2 % IJ SOLN
INTRAMUSCULAR | Status: AC
  Filled 2024-03-04: qty 5

## 2024-03-04 MED ORDER — MIDAZOLAM HCL 2 MG/2ML IJ SOLN
INTRAMUSCULAR | Status: AC
  Filled 2024-03-04: qty 2

## 2024-03-04 MED ORDER — ACETAMINOPHEN 500 MG PO TABS
1000.0000 mg | ORAL_TABLET | ORAL | Status: AC
  Administered 2024-03-04: 1000 mg via ORAL
  Filled 2024-03-04: qty 2

## 2024-03-04 MED ORDER — SUGAMMADEX SODIUM 200 MG/2ML IV SOLN
INTRAVENOUS | Status: DC | PRN
Start: 2024-03-04 — End: 2024-03-04
  Administered 2024-03-04: 200 mg via INTRAVENOUS

## 2024-03-04 MED ORDER — AMISULPRIDE (ANTIEMETIC) 5 MG/2ML IV SOLN: 10.0000 mg | Freq: Once | INTRAVENOUS | Status: DC | PRN

## 2024-03-04 MED ORDER — MIDAZOLAM HCL 5 MG/5ML IJ SOLN
INTRAMUSCULAR | Status: DC | PRN
  Administered 2024-03-04: 2 mg via INTRAVENOUS

## 2024-03-04 MED ORDER — INSULIN ASPART 100 UNIT/ML IJ SOLN
INTRAMUSCULAR | Status: AC
  Filled 2024-03-04: qty 1

## 2024-03-04 MED ORDER — ROCURONIUM BROMIDE 10 MG/ML (PF) SYRINGE
PREFILLED_SYRINGE | INTRAVENOUS | Status: AC
Start: 2024-03-04 — End: ?
  Filled 2024-03-04: qty 10

## 2024-03-04 MED ORDER — ROCURONIUM BROMIDE 100 MG/10ML IV SOLN
INTRAVENOUS | Status: DC | PRN
  Administered 2024-03-04: 50 mg via INTRAVENOUS

## 2024-03-04 SURGICAL SUPPLY — 27 items
BAG COUNTER SPONGE SURGICOUNT (BAG) IMPLANT
COVER SURGICAL LIGHT HANDLE (MISCELLANEOUS) ×1 IMPLANT
DERMABOND ADVANCED .7 DNX12 (GAUZE/BANDAGES/DRESSINGS) ×1 IMPLANT
DRAIN PENROSE 0.5X18 (DRAIN) ×1 IMPLANT
DRAPE LAPAROTOMY TRNSV 102X78 (DRAPES) ×1 IMPLANT
ELECT REM PT RETURN 15FT ADLT (MISCELLANEOUS) ×1 IMPLANT
GLOVE BIO SURGEON STRL SZ7.5 (GLOVE) ×1 IMPLANT
GLOVE INDICATOR 8.0 STRL GRN (GLOVE) ×1 IMPLANT
GOWN STRL REUS W/ TWL XL LVL3 (GOWN DISPOSABLE) ×2 IMPLANT
KIT BASIN OR (CUSTOM PROCEDURE TRAY) ×1 IMPLANT
KIT TURNOVER KIT A (KITS) IMPLANT
MARKER SKIN DUAL TIP RULER LAB (MISCELLANEOUS) ×1 IMPLANT
MESH ULTRAPRO 3X6 7.6X15CM (Mesh General) IMPLANT
NDL HYPO 22X1.5 SAFETY MO (MISCELLANEOUS) ×1 IMPLANT
NEEDLE HYPO 22X1.5 SAFETY MO (MISCELLANEOUS) ×1 IMPLANT
NS IRRIG 1000ML POUR BTL (IV SOLUTION) ×1 IMPLANT
PACK GENERAL/GYN (CUSTOM PROCEDURE TRAY) ×1 IMPLANT
SPIKE FLUID TRANSFER (MISCELLANEOUS) ×1 IMPLANT
SUT ETHIBOND 0 MO6 C/R (SUTURE) ×2 IMPLANT
SUT MNCRL AB 4-0 PS2 18 (SUTURE) ×1 IMPLANT
SUT PDS AB 2-0 CT2 27 (SUTURE) IMPLANT
SUT VIC AB 2-0 SH 27X BRD (SUTURE) ×1 IMPLANT
SUT VIC AB 3-0 SH 27X BRD (SUTURE) ×1 IMPLANT
SYR 20ML LL LF (SYRINGE) ×1 IMPLANT
TOWEL OR 17X26 10 PK STRL BLUE (TOWEL DISPOSABLE) ×1 IMPLANT
TRAY FOLEY MTR SLVR 14FR STAT (SET/KITS/TRAYS/PACK) ×1 IMPLANT
TRAY FOLEY MTR SLVR 16FR STAT (SET/KITS/TRAYS/PACK) ×1 IMPLANT

## 2024-03-04 NOTE — Discharge Instructions (Addendum)
 POST OP INSTRUCTIONS  DIET: As tolerated. Follow a light bland diet the first 24 hours after arrival home, such as soup, liquids, crackers, etc.  Be sure to include lots of fluids daily.  Avoid fast food or heavy meals as your are more likely to get nauseated.  Eat a low fat the next few days after surgery.  Take your usually prescribed home medications unless otherwise directed.  PAIN CONTROL: Pain is best controlled by a usual combination of three different methods TOGETHER: Ice/Heat Over the counter pain medication Prescription pain medication Most patients will experience some swelling and bruising around the surgical site.  Ice packs or heating pads (30-60 minutes up to 6 times a day) will help. Some people prefer to use ice alone, heat alone, alternating between ice & heat.  Experiment to what works for you.  Swelling and bruising can take several weeks to resolve.    It is helpful to take an over-the-counter pain medication regularly for the first few weeks: Acetaminophen  (Tylenol ) - you may take 650mg  every 6 hours as needed. If you are taking a narcotic pain medication that has acetaminophen  in it, do not take over the counter tylenol  at the same time.  A  prescription for pain medication should be given to you upon discharge.  Take your pain medication as prescribed if your pain is not adequatly controlled with the over-the-counter pain reliefs mentioned above.  Avoid getting constipated.  Between the surgery and the pain medications, it is common to experience some constipation.  Increasing fluid intake and taking a fiber supplement (such as Metamucil, Citrucel, FiberCon, MiraLax, etc) 1-2 times a day regularly will usually help prevent this problem from occurring.  A mild laxative (prune juice, Milk of Magnesia, MiraLax, etc) should be taken according to package directions if there are no bowel movements after 48 hours.    Dressing: Your incision is covered in Dermabond which is like  sterile superglue for the skin. This will come off on it's own in a couple weeks. It is waterproof and you may bathe normally starting the day after your surgery in a shower. Avoid baths/pools/lakes/oceans until your wounds have fully healed.  ACTIVITIES as tolerated:   Avoid heavy lifting (>10lbs or 1 gallon of milk) for the next 6 weeks. You may resume regular (light) daily activities beginning the next day--such as daily self-care, walking, climbing stairs--gradually increasing activities as tolerated.  If you can walk 30 minutes without difficulty, it is safe to try more intense activity such as jogging, treadmill, bicycling, low-impact aerobics.  DO NOT PUSH THROUGH PAIN.  Let pain be your guide: If it hurts to do something, don't do it. You may drive when you are no longer taking prescription pain medication, you can comfortably wear a seatbelt, and you can safely maneuver your car and apply brakes.   FOLLOW UP in our office Please call [facility] at [phone] to set up an appointment to see your surgeon in the office for a follow-up appointment approximately 2 weeks after your surgery. Make sure that you call for this appointment the day you arrive home to insure a convenient appointment time.  9. If you have disability or family leave forms that need to be completed, you may have them completed by your primary care physician's office; for return to work instructions, please ask our office staff and they will be happy to assist you in obtaining this documentation   When to call us  [phone]: Poor pain control  Reactions / problems with new medications (rash/itching, etc)  Fever over 101.5 F (38.5 C) Inability to urinate Nausea/vomiting Worsening swelling or bruising Continued bleeding from incision. Increased pain, redness, or drainage from the incision  The clinic staff is available to answer your questions during regular business hours (8:30am-5pm).  Please don't hesitate to call  and ask to speak to one of our nurses for clinical concerns.   A surgeon from [facility] is always on call at the hospitals   If you have a medical emergency, go to the nearest emergency room or call 911.  [facility] [address], [place], [place]  [address] MAIN: [phone] FAX: [phone] [email or web address]

## 2024-03-04 NOTE — Transfer of Care (Signed)
 Immediate Anesthesia Transfer of Care Note  Patient: Antonio Fisher  Procedure(s) Performed: REPAIR, HERNIA, INGUINAL, ADULT (Left)  Patient Location: PACU  Anesthesia Type:General  Level of Consciousness: awake, alert , and oriented  Airway & Oxygen Therapy: Patient Spontanous Breathing and Patient connected to face mask oxygen  Post-op Assessment: Report given to RN and Post -op Vital signs reviewed and stable  Post vital signs: Reviewed and stable  Last Vitals:  Vitals Value Taken Time  BP 146/87 03/04/24 1448  Temp    Pulse 69 03/04/24 1450  Resp 16 03/04/24 1450  SpO2 100 % 03/04/24 1450  Vitals shown include unfiled device data.  Last Pain:  Vitals:   03/04/24 1121  TempSrc:   PainSc: 5       Patients Stated Pain Goal: 0 (03/04/24 1121)  Complications: No notable events documented.

## 2024-03-04 NOTE — Op Note (Signed)
 03/04/2024  2:46 PM  PATIENT:  Antonio Fisher  63 y.o. male  Patient Care Team: Dettinger, Lucio Sabin, MD as PCP - General (Family Medicine)  PRE-OPERATIVE DIAGNOSIS:  Symptomatic left inguinal hernia  POST-OPERATIVE DIAGNOSIS:  Left indirect sliding inguinal hernia  PROCEDURE:  Open left inguinal hernia repair with mesh  SURGEON:  Renford Cartwright. Kohl Polinsky, MD  ASSISTANT: OR staff  ANESTHESIA:  General endotracheal  COUNTS:  Sponge, needle and instrument counts were reported correct x2 at the conclusion of the operation.  EBL: 5 mL  DRAINS: None  SPECIMEN: None  FINDINGS: Indirect left inguinal hernia, sliding-type containing sigmoid colon.  Additional cord lipoma.  Standard Lichtenstein repair carried out.  DESCRIPTION: The patient was identified in preop holding and taken to the OR where they were placed supine on the operating room table and SCDs were placed. General endotracheal anesthesia was induced without difficulty. Hair in the region of the planned incisions was clipped. The patient was then prepped and draped in the usual sterile fashion. A surgical timeout was performed indicating the correct patient, procedure, positioning and need for preoperative antibiotics.  0.25% Marcaine  with epinephrine  + Exparel  was used to anesthetize the skin over the mid-portion of the inguinal canal. An oblique incision was made. Dissection was carried down through the subcutaneous tissue with cautery to the external oblique fascia.  The external oblique fascia was opened along the direction of its fibers to the external ring.  The spermatic cord and hernia sac was circumferentially dissected bluntly and retracted with a Penrose drain.  The ilioinguinal nerve was identified; ultimately, it appeared that this would be within the path of where his mesh was lying and therefore was divided and removed to prevent incorporation into the mesh and chronic inguinal pain.  The floor of the inguinal canal  was inspected.  The cord structures were all identified and then dissected. The hernia sac was identified, care being taken not to skeletonize the cord, this was dissected free from the cord structures. The hernia was reduced and the sac empty. The empty sac was then carefully opened sharply at the distal end of the sac where it was thinnest.  There was retroperitoneal fat type adhesions within the sac and all the way at the level of the deep inguinal ring, it is apparent that there is a loop of presumably sigmoid colon at this level.  This is all consistent with a sliding type inguinal hernia.  The peritoneal sac was therefore closed using a 2-0 Vicryl suture again well away from the contents.  We opted not to excise any of the sac given these findings and therefore reduced it into the abdominal cavity.  We are able to then imbricate the inguinal floor over this closure.  This was done using 0 Ethibond sutures. A piece of light weight prolene mesh was cut to fit the inguinal floor and deployed. The mesh was secured to the pubic tubercle using two 0 Ethibond sutures - ensuring that there is 2 cm of overlap between the mesh and the pubic tubercle. The mesh was then secured using additional 0 Ethibond suture to the shelving edge of the inguinal ligament inferiorly and the conjoined tendon superiorly. The mesh was tucked underneath the external oblique fascia laterally.  The tails of the mesh were then closed around the spermatic cord to recreate the internal inguinal ring and this ring was secured using 0 Ethibond suture. One of these fractured during tying and was therefore replaced with a 0  PDS suture. The external oblique fascia was reapproximated with 2-0 Vicryl.  3-0 Vicryl was then used to close Scarpa's fascia.  The wound is then irrigated.  Hemostasis is verified.  A running 4-0 Monocryl subcuticular suture was used to close the skin.  Dermabond was the applied. He was then awakened from general anesthesia,  extubated and taken to the recovery in satisfactory condition.  DISPOSITION: PACU in satisfactory condition

## 2024-03-04 NOTE — H&P (Addendum)
 CC: Here today for surgery  HPI: Antonio Fisher is an 63 y.o. male whom presented to our office with left inguinal hernia and groin pain. He was accompanied by his wife.  He noted a three-week history of left groin pain associated with a noticeable bulge. The pain is described as a 'knot' that protrudes and can be manually reduced, providing temporary relief. No prior groin or abdominal surgeries are reported. 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 He denies any right groin pain or bulge. He has never had anything popout in the right groin.  He experienced swelling in his hands and legs, prompting an emergency room visit last Thursday. He also had groin pain and a CT scan was done which showed a left inguinal hernia and possibly a small right inguinal hernia  He denies any changes in health or health history since we met in the office. No new medications/allergies. He states he is ready for surgery today. Still with intermittent Left groin pain - no bulge or pain on right side. Re-accompanied today with his wife.  PMH: DM, HLD  PSH: Denies any prior surgical history  FHx: Denies any known family history of colorectal, breast, endometrial or ovarian cancer  Social Hx: Chews tobacco; otherwise, denies use of EtOH/illicit drug. He works in [employer]. He is here with his wife. They are from [place], [place]   Past Medical History:  Diagnosis Date   Arthritis    Diabetes mellitus without complication (HCC)    Gout    History of kidney stones    Hypertension     Past Surgical History:  Procedure Laterality Date   HAND SURGERY Right    ORIF    Family History  Problem Relation Age of Onset   Diabetes Father    Heart disease Brother    Heart attack Maternal Grandmother    Early death Maternal Grandfather    Diabetes Paternal Grandmother     Social:  reports that he has quit smoking. His  smokeless tobacco use includes chew. He reports that he does not drink alcohol and does not use drugs.  Allergies:  Allergies  Allergen Reactions   Diclofenac  Itching    Medications: I have reviewed the patient's current medications.  Results for orders placed or performed during the hospital encounter of 03/04/24 (from the past 48 hours)  Glucose, capillary     Status: Abnormal   Collection Time: 03/04/24 11:08 AM  Result Value Ref Range   Glucose-Capillary 204 (H) 70 - 99 mg/dL    Comment: Glucose reference range applies only to samples taken after fasting for at least 8 hours.    No results found.   PE Blood pressure (!) 150/90, pulse 62, temperature 97.8 F (36.6 C), temperature source Oral, resp. rate 15, SpO2 97%. Constitutional: NAD; conversant Eyes: Moist conjunctiva; no lid lag; anicteric Lungs: Normal respiratory effort CV: RRR GI: Abd soft, NT/ND; palpable bulge in the left groin that is uncomfortable for him that is clearly reducible. Right groin is flat. There are no palpable bulges. I do not feel any defects in the inguinal floor. ; no palpable hepatosplenomegaly Psychiatric: Appropriate affect  Results for orders placed or performed during the hospital encounter of 03/04/24 (from the past 48 hours)  Glucose, capillary     Status: Abnormal   Collection Time: 03/04/24 11:08 AM  Result Value Ref Range   Glucose-Capillary 204 (H) 70 - 99 mg/dL  Comment: Glucose reference range applies only to samples taken after fasting for at least 8 hours.    No results found.  A/P: WAKE CONLEE is an 63 y.o. male with hx of HLD, DM, here for evaluation of left groin pain, left inguinal hernia  - We spent time reviewing with him everything. It does appear on his most recent CT scan that he may have a small right inguinal hernia at least by radiographic evaluation. On physical exam, there is no palpable defect in the floor nor is there any bulge. He has had no symptoms.  We discussed options going forward including unilateral left inguinal hernia repair versus planned bilateral inguinal hernia repair. We spent time discussing a laparoscopic based approach for bilateral inguinal hernia surgery versus an open based approach for unilateral hernia surgery. He wishes not to have any sort of surgery that would involve clearly going into the abdomen such as a laparoscopic based approach and opts to proceed with a unilateral open left inguinal hernia repair. - He understands in the future he could be at risk for developing a right inguinal hernia necessitating a repair but plans to revisit that at that time if it becomes symptomatic.  -The anatomy and physiology of the GI tract and abdominal wall was reviewed with the patient and his wife with associated illustrations. The pathophysiology of hernias was covered as well. -The options for treatment were reviewed including further observation (with risk of worsening pain, incarceration of hernia, potential need for urgent/emergent procedures) vs surgery -open left inguinal hernia repair with mesh  -The planned procedure, material risks (including, but not limited to, pain, bleeding, infection, scarring, need for blood transfusion, damage to surrounding structures-blood vessels/nerves/viscus/organs, need for additional procedures, recurrence, chronic pain, mesh-related complications including erosion into other structures/vessels/organs/viscus, worsening of pre-existing medical conditions, pneumonia, heart attack, stroke, death) benefits and alternatives to surgery were discussed at length. I noted a good probability that the procedure help improve his symptoms. We discussed the benefits of a mesh based approach as a much lower risk for hernia recurrence versus primary repair of suture only. He prefers to proceed with a mesh based repair. The patient's and his wife's questions were answered to their satisfaction, they voiced understanding  and they elected to proceed with surgery. Additionally, we discussed typical postoperative expectations and the recovery process.   Beatris Lincoln, MD [facility]

## 2024-03-04 NOTE — Anesthesia Preprocedure Evaluation (Addendum)
 Anesthesia Evaluation  Patient identified by MRN, date of birth, ID band Patient awake    Reviewed: Allergy & Precautions, NPO status , Patient's Chart, lab work & pertinent test results  Airway Mallampati: II  TM Distance: >3 FB Neck ROM: Full    Dental  (+) Dental Advisory Given, Edentulous Lower, Edentulous Upper   Pulmonary former smoker   Pulmonary exam normal breath sounds clear to auscultation       Cardiovascular hypertension, Pt. on medications Normal cardiovascular exam Rhythm:Regular Rate:Normal     Neuro/Psych negative neurological ROS     GI/Hepatic negative GI ROS, Neg liver ROS,,,L Internal inguinal hernia   Endo/Other  diabetes, Type 2, Oral Hypoglycemic Agents    Renal/GU negative Renal ROS     Musculoskeletal  (+) Arthritis ,    Abdominal   Peds  Hematology negative hematology ROS (+)   Anesthesia Other Findings Day of surgery medications reviewed with the patient.  Reproductive/Obstetrics                             Anesthesia Physical Anesthesia Plan  ASA: 2  Anesthesia Plan: General   Post-op Pain Management: Tylenol  PO (pre-op)* and Toradol  IV (intra-op)*   Induction: Intravenous  PONV Risk Score and Plan: 3 and Midazolam , Dexamethasone  and Ondansetron   Airway Management Planned: Oral ETT  Additional Equipment:   Intra-op Plan:   Post-operative Plan: Extubation in OR  Informed Consent: I have reviewed the patients History and Physical, chart, labs and discussed the procedure including the risks, benefits and alternatives for the proposed anesthesia with the patient or authorized representative who has indicated his/her understanding and acceptance.     Dental advisory given  Plan Discussed with: CRNA  Anesthesia Plan Comments:         Anesthesia Quick Evaluation

## 2024-03-04 NOTE — Anesthesia Postprocedure Evaluation (Signed)
 Anesthesia Post Note  Patient: Antonio Fisher  Procedure(s) Performed: REPAIR, HERNIA, INGUINAL, ADULT (Left)     Patient location during evaluation: PACU Anesthesia Type: General Level of consciousness: awake and alert Pain management: pain level controlled Vital Signs Assessment: post-procedure vital signs reviewed and stable Respiratory status: spontaneous breathing, nonlabored ventilation and respiratory function stable Cardiovascular status: blood pressure returned to baseline and stable Postop Assessment: no apparent nausea or vomiting Anesthetic complications: no   No notable events documented.  Last Vitals:  Vitals:   03/04/24 1500 03/04/24 1503  BP: (!) 147/87   Pulse: 66 69  Resp: 14 14  Temp:    SpO2: 93% 97%    Last Pain:  Vitals:   03/04/24 1503  TempSrc:   PainSc: 5                  Erin Havers

## 2024-03-04 NOTE — Anesthesia Procedure Notes (Signed)
 Procedure Name: Intubation Date/Time: 03/04/2024 1:29 PM  Performed by: Bing Buff, RNPre-anesthesia Checklist: Patient identified, Emergency Drugs available, Suction available and Patient being monitored Patient Re-evaluated:Patient Re-evaluated prior to induction Oxygen Delivery Method: Circle system utilized Preoxygenation: Pre-oxygenation with 100% oxygen Induction Type: IV induction Ventilation: Mask ventilation without difficulty and Oral airway inserted - appropriate to patient size Laryngoscope Size: Mac and 4 Grade View: Grade I Tube type: Oral Tube size: 7.0 mm Number of attempts: 1 Airway Equipment and Method: Stylet and Oral airway Placement Confirmation: ETT inserted through vocal cords under direct vision, positive ETCO2 and breath sounds checked- equal and bilateral Secured at: 24 cm Tube secured with: Tape Dental Injury: Teeth and Oropharynx as per pre-operative assessment

## 2024-03-05 ENCOUNTER — Encounter (HOSPITAL_COMMUNITY): Payer: Self-pay | Admitting: Surgery

## 2024-03-08 ENCOUNTER — Ambulatory Visit: Admitting: Family Medicine

## 2024-04-19 ENCOUNTER — Ambulatory Visit: Admitting: Family Medicine

## 2024-04-19 ENCOUNTER — Encounter: Payer: Self-pay | Admitting: Family Medicine

## 2024-04-19 VITALS — BP 138/85 | HR 85 | Ht 65.0 in | Wt 172.0 lb

## 2024-04-19 DIAGNOSIS — E785 Hyperlipidemia, unspecified: Secondary | ICD-10-CM

## 2024-04-19 DIAGNOSIS — I152 Hypertension secondary to endocrine disorders: Secondary | ICD-10-CM | POA: Diagnosis not present

## 2024-04-19 DIAGNOSIS — E1169 Type 2 diabetes mellitus with other specified complication: Secondary | ICD-10-CM

## 2024-04-19 DIAGNOSIS — E1159 Type 2 diabetes mellitus with other circulatory complications: Secondary | ICD-10-CM | POA: Diagnosis not present

## 2024-04-19 DIAGNOSIS — Z7984 Long term (current) use of oral hypoglycemic drugs: Secondary | ICD-10-CM

## 2024-04-19 LAB — BAYER DCA HB A1C WAIVED: HB A1C (BAYER DCA - WAIVED): 8.7 % — ABNORMAL HIGH (ref 4.8–5.6)

## 2024-04-19 MED ORDER — LISINOPRIL 2.5 MG PO TABS: 2.5000 mg | ORAL_TABLET | Freq: Every day | ORAL | 3 refills | Status: AC

## 2024-04-19 MED ORDER — METFORMIN HCL 1000 MG PO TABS: 1000.0000 mg | ORAL_TABLET | Freq: Two times a day (BID) | ORAL | 3 refills | Status: AC

## 2024-04-19 MED ORDER — GLIPIZIDE 5 MG PO TABS: 5.0000 mg | ORAL_TABLET | Freq: Two times a day (BID) | ORAL | 3 refills | Status: AC

## 2024-04-19 MED ORDER — TRULICITY 4.5 MG/0.5ML ~~LOC~~ SOAJ
4.5000 mg | SUBCUTANEOUS | 3 refills | Status: AC
Start: 2024-04-19 — End: ?

## 2024-04-19 NOTE — Progress Notes (Signed)
 BP 138/85   Pulse 85   Ht 5\' 5"  (1.651 m)   Wt 172 lb (78 kg)   SpO2 98%   BMI 28.62 kg/m    Subjective:   Patient ID: Antonio Fisher, male    DOB: [birth year], 63 y.o.   MRN: [id number]  HPI: Antonio Fisher is a 63 y.o. male presenting on 04/19/2024 for Medical Management of Chronic Issues, Diabetes, and left pelvic pain (Recent inguinal hernia surgery- 5w ago)   HPI Type 2 diabetes mellitus Patient comes in today for recheck of his diabetes. Patient has been currently taking Trulicity  and metformin . Patient is currently on an ACE inhibitor/ARB. Patient has seen an ophthalmologist this year. Patient denies any new issues with their feet. The symptom started onset as an adult hypertension and hyperlipidemia ARE RELATED TO DM   Hypertension Patient is currently on lisinopril , and their blood pressure today is 138/85. Patient denies any lightheadedness or dizziness. Patient denies headaches, blurred vision, chest pains, shortness of breath, or weakness. Denies any side effects from medication and is content with current medication.   Hyperlipidemia Patient is coming in for recheck of his hyperlipidemia. The patient is currently taking Zetia , intolerant to statin. They deny any issues with myalgias or history of liver damage from it. They deny any focal numbness or weakness or chest pain.   Relevant past medical, surgical, family and social history reviewed and updated as indicated. Interim medical history since our last visit reviewed. Allergies and medications reviewed and updated.  Review of Systems  Constitutional:  Negative for chills and fever.  Eyes:  Negative for visual disturbance.  Respiratory:  Negative for shortness of breath and wheezing.   Cardiovascular:  Negative for chest pain and leg swelling.  Musculoskeletal:  Negative for back pain and gait problem.  Skin:  Negative for rash.  Neurological:  Negative for dizziness and light-headedness.  All other systems  reviewed and are negative.   Per HPI unless specifically indicated above   Allergies as of 04/19/2024       Reactions   Diclofenac  Itching        Medication List        Accurate as of April 19, 2024  8:19 AM. If you have any questions, ask your nurse or doctor.          ezetimibe  10 MG tablet Commonly known as: Zetia  Take 1 tablet (10 mg total) by mouth daily.   glipiZIDE 5 MG tablet Commonly known as: GLUCOTROL Take 1 tablet (5 mg total) by mouth 2 (two) times daily before a meal. Started by: Lucio Sabin Nereyda Bowler   indomethacin  50 MG capsule Commonly known as: INDOCIN  Take 1 capsule (50 mg total) by mouth 2 (two) times daily with a meal.   lisinopril  2.5 MG tablet Commonly known as: ZESTRIL  Take 1 tablet (2.5 mg total) by mouth daily.   metFORMIN  1000 MG tablet Commonly known as: GLUCOPHAGE  Take 1 tablet (1,000 mg total) by mouth 2 (two) times daily with a meal.   predniSONE  10 MG (21) Tbpk tablet Commonly known as: STERAPRED UNI-PAK 21 TAB Take 6 tablets on day one, 5 on day two, 4 on day three, 3 on day four, 2 on day five, and 1 on day six. Take with food.   triamcinolone  cream 0.1 % Commonly known as: KENALOG  APPLY TOPICALLY TWICE DAILY   Trulicity  4.5 MG/0.5ML Soaj Generic drug: Dulaglutide  Inject 4.5 mg as directed once a week.  Objective:   BP 138/85   Pulse 85   Ht 5\' 5"  (1.651 m)   Wt 172 lb (78 kg)   SpO2 98%   BMI 28.62 kg/m   Wt Readings from Last 3 Encounters:  04/19/24 172 lb (78 kg)  02/16/24 172 lb (78 kg)  02/15/24 172 lb (78 kg)    Physical Exam Vitals and nursing note reviewed.  Constitutional:      General: He is not in acute distress.    Appearance: He is well-developed. He is not diaphoretic.  Eyes:     General: No scleral icterus.    Conjunctiva/sclera: Conjunctivae normal.  Neck:     Thyroid : No thyromegaly.  Cardiovascular:     Rate and Rhythm: Normal rate and regular rhythm.     Heart sounds: Normal  heart sounds. No murmur heard. Pulmonary:     Effort: Pulmonary effort is normal. No respiratory distress.     Breath sounds: Normal breath sounds. No wheezing.  Musculoskeletal:        General: No swelling. Normal range of motion.     Cervical back: Neck supple.  Lymphadenopathy:     Cervical: No cervical adenopathy.  Skin:    General: Skin is warm and dry.     Findings: No rash.  Neurological:     Mental Status: He is alert and oriented to person, place, and time.     Coordination: Coordination normal.  Psychiatric:        Behavior: Behavior normal.       Assessment & Plan:   Problem List Items Addressed This Visit       Cardiovascular and Mediastinum   Hypertension associated with diabetes (HCC)   Relevant Medications   Dulaglutide  (TRULICITY ) 4.5 MG/0.5ML SOAJ   lisinopril  (ZESTRIL ) 2.5 MG tablet   metFORMIN  (GLUCOPHAGE ) 1000 MG tablet   glipiZIDE (GLUCOTROL) 5 MG tablet     Endocrine   Type 2 diabetes mellitus (HCC) - Primary   Relevant Medications   Dulaglutide  (TRULICITY ) 4.5 MG/0.5ML SOAJ   lisinopril  (ZESTRIL ) 2.5 MG tablet   metFORMIN  (GLUCOPHAGE ) 1000 MG tablet   glipiZIDE (GLUCOTROL) 5 MG tablet   Other Relevant Orders   Bayer DCA Hb A1c Waived   Hyperlipidemia associated with type 2 diabetes mellitus (HCC)   Relevant Medications   Dulaglutide  (TRULICITY ) 4.5 MG/0.5ML SOAJ   lisinopril  (ZESTRIL ) 2.5 MG tablet   metFORMIN  (GLUCOPHAGE ) 1000 MG tablet   glipiZIDE (GLUCOTROL) 5 MG tablet    A1c is up at 8.7.  He says his blood sugars been running as high as 200s in the morning.  Will add glipizide to his Trulicity  and metformin . Follow up plan: Return in about 3 months (around 07/20/2024), or if symptoms worsen or fail to improve, for Diabetes recheck.  Counseling provided for all of the vaccine components Orders Placed This Encounter  Procedures   Bayer DCA Hb A1c Waived    Jolyne Needs, MD [facility] 04/19/2024, 8:19  AM

## 2024-04-27 ENCOUNTER — Other Ambulatory Visit: Payer: Self-pay | Admitting: Family Medicine

## 2024-04-29 ENCOUNTER — Ambulatory Visit: Payer: Self-pay

## 2024-04-29 ENCOUNTER — Other Ambulatory Visit: Payer: Self-pay

## 2024-04-29 DIAGNOSIS — M2550 Pain in unspecified joint: Secondary | ICD-10-CM

## 2024-04-29 MED ORDER — PREDNISONE 20 MG PO TABS: 20.0000 mg | ORAL_TABLET | Freq: Two times a day (BID) | ORAL | 0 refills | Status: DC

## 2024-04-29 NOTE — Telephone Encounter (Signed)
 Medication sent in.

## 2024-04-29 NOTE — Telephone Encounter (Signed)
 Spoke with daughter who stated pt is hacing a pain and hard time moving around.PPt requesting a steroid. Advised will need appt. Daughter stated to call pt and speak to pt fir further information and to make appt.  Called pt on number provided by daughter [phone]. Left message of VM to call back.

## 2024-04-29 NOTE — Telephone Encounter (Signed)
 FYI Only or Action Required?: FYI only for provider  Patient was last seen in primary care on 04/19/2024 by Dettinger, Lucio Sabin, MD. Called Nurse Triage reporting Pain. Symptoms began several weeks ago. Interventions attempted: OTC medications: See note . Symptoms are: stable.  Triage Disposition: See Physician Within 24 Hours Scheduled   Patient/caregiver understands and will follow disposition?: Yes                Copied from CRM [id number]. Topic: Clinical - Red Word Triage >> Apr 29, 2024  3:50 PM Georgeann Kindred wrote: Red Word that prompted transfer to Nurse Triage: Daughter, Alexis Anger, states that patient is having pain all over and having difficulty walking. Reason for Disposition  High-risk adult (e.g., history of cancer, HIV, or IV drug use)  Answer Assessment - Initial Assessment Questions 1. ONSET: When did the pain begin?      3 months  2. LOCATION: Where does it hurt? (upper, mid or lower back)     --- Back, Knees, and feet bilaterally    3. SEVERITY: How bad is the pain?  (e.g., Scale 1-10; mild, moderate, or severe)   - MILD (1-3): Doesn't interfere with normal activities.    - MODERATE (4-7): Interferes with normal activities or awakens from sleep.    - SEVERE (8-10): Excruciating pain, unable to do any normal activities.       --------------------Moderate    4. PATTERN: Is the pain constant? (e.g., yes, no; constant, intermittent)      -----Constant    5. RADIATION: Does the pain shoot into your legs or somewhere else?     --------Denies  6. CAUSE:  What do you think is causing the back pain?      - Unsure, has had this issue before. Never dx with anything.   8. MEDICINES: What have you taken so far for the pain? (e.g., nothing, acetaminophen , NSAIDS)     --------------- Ibu/Acet: minimal relief.   9. NEUROLOGIC SYMPTOMS: Do you have any weakness, numbness, or problems with bowel/bladder control?     -------- n/a   10. OTHER  SYMPTOMS: Do you have any other symptoms? (e.g., fever, abdomen pain, burning with urination, blood in urine)       --- Swelling in hands, knees and feet.    Additional Io:  Reports he has had pain like this prior. Pain was responsive to prednisone . Appointment scheduled for patient appropriately.  Patient educated on pertinent s/s that would warrant emergent help/call 911/ ED/UC.  Patient verbalized understanding and agrees with plan . No additional questions/concerns noted during the time of the call.  Protocols used: Back Pain-A-AH

## 2024-04-29 NOTE — Telephone Encounter (Signed)
 Give prednison 40mg  daily for 5 days and make sure he follows up with the orthopedic 2, no we placed a referral for it

## 2024-04-29 NOTE — Telephone Encounter (Signed)
 FYI Only or Action Required?: Action required by provider  Patient was last seen in primary care on 04/19/2024 by Dettinger, Lucio Sabin, MD. Called Nurse Triage reporting Difficulty Walking. Symptoms began several months ago. Interventions attempted: Rest, hydration, or home remedies. Symptoms are: gradually worsening. On going joint pain and swelling. Wife reports his doctor knows about it. We think a steroid might help his pain. Please advise pt.  Triage Disposition: See Physician Within 24 Hours  Patient/caregiver understands and will follow disposition?: No, wishes to speak with PCP         Reason for Disposition  Painful joint  Answer Assessment - Initial Assessment Questions 1. LOCATION: Which joint is swollen? (upper leg, lower leg, foot or in a joint)     Both legs 2. ONSET: When did the swelling start?     Few month ago 3. SIZE: How large is the swelling?     moderate 4. PAIN: Is there any pain? If so, ask: How bad is it?     Moderate 5. CAUSE: What do you think caused the swollen joint? Was there an insect bite?     Unsure  Protocols used: Leg Joint Swelling-P-AH

## 2024-04-29 NOTE — Telephone Encounter (Signed)
 Left message to call back

## 2024-04-29 NOTE — Telephone Encounter (Signed)
 Copied from CRM [id number]. Topic: Clinical - Red Word Triage >> Apr 29, 2024  7:31 AM Juluis Ok wrote: [facility] that prompted transfer to Nurse Triage: difficulty walking   ----------------------------------------------------------------------- From previous Reason for Contact - Medication Refill: Medication:   Has the patient contacted their pharmacy?   (Agent: If no, request that the patient contact the pharmacy for the refill. If patient does not wish to contact the pharmacy document the reason why and proceed with request.) (Agent: If yes, when and what did the pharmacy advise?)  This is the patient's preferred pharmacy:  [id number] - [place], [place] - [address] [place] [address] Phone: [phone] Fax: [phone]  [facility] [place], [place] - [address] [place] [address] Phone: [phone] Fax: [phone]  Is this the correct pharmacy for this prescription?   If no, delete pharmacy and type the correct one.   Has the prescription been filled recently?    Is the patient out of the medication?    Has the patient been seen for an appointment in the last year OR does the patient have an upcoming appointment?    Can we respond through MyChart?    Agent: Please be advised that Rx refills may take up to 3 business days. We ask that you follow-up with your pharmacy.

## 2024-04-30 ENCOUNTER — Ambulatory Visit: Admitting: Family

## 2024-06-26 ENCOUNTER — Other Ambulatory Visit: Payer: Self-pay | Admitting: Family Medicine

## 2024-06-26 ENCOUNTER — Telehealth: Payer: Self-pay

## 2024-06-26 DIAGNOSIS — M2550 Pain in unspecified joint: Secondary | ICD-10-CM

## 2024-06-26 NOTE — Telephone Encounter (Signed)
 Patient's wife called back, states they were not contacted regarding the refused prednisone . She states it is being requested d/t a gout flare up. Attempted to schedule soonest appt on 8/15, but she is not with patient and will call back.

## 2024-06-26 NOTE — Telephone Encounter (Signed)
 FYI Only or Action Required?: Action required by provider: medication refill request.  Patient was last seen in primary care on 04/19/2024 by Dettinger, Fonda LABOR, MD.  Called Nurse Triage reporting No chief complaint on file..  Symptoms began today.  Interventions attempted: Nothing.  Symptoms are: stable.  Triage Disposition: No disposition on file.  Patient/caregiver understands and will follow disposition?:

## 2024-06-26 NOTE — Telephone Encounter (Signed)
 Copied from CRM [id number]. Topic: Clinical - Red Word Triage >> Jun 26, 2024  9:42 AM Sophia H wrote: Red Word that prompted transfer to Nurse Triage: Patients daughter Hunter is calling on behalf of the patient, states patient has swelling in his hands and it has been hard to open/close them because of it. Ongoing issue but flare up started yesterday, states since it comes and goes the patient was told to contact Dr. Maryanne for prednisone  refill. Please reach out to patient for NT, he was not with the caller at the time of the call. [phone] (Mobile) Refill request sent This encounter was created in error - please disregard.

## 2024-06-26 NOTE — Telephone Encounter (Signed)
 Copied from CRM [id number]. Topic: Clinical - Medication Refill >> Jun 26, 2024  9:40 AM Sophia H wrote: Medication: predniSONE  (DELTASONE ) 20 MG tablet   Has the patient contacted their pharmacy? Yes, told to contact office  This is the patient's preferred pharmacy:  [id number] - [place], [place] - [address] [place] [address] Phone: [phone] Fax: [phone]   Is this the correct pharmacy for this prescription? Yes If no, delete pharmacy and type the correct one.   Has the prescription been filled recently? Yes  Is the patient out of the medication? Yes  Has the patient been seen for an appointment in the last year OR does the patient have an upcoming appointment? Yes, seen June 6  Can we respond through MyChart? Yes  Agent: Please be advised that Rx refills may take up to 3 business days. We ask that you follow-up with your pharmacy.

## 2024-06-28 ENCOUNTER — Ambulatory Visit: Admitting: Family Medicine

## 2024-06-28 ENCOUNTER — Encounter: Payer: Self-pay | Admitting: Family Medicine

## 2024-06-28 VITALS — BP 132/73 | HR 81 | Temp 97.4°F | Ht 65.0 in | Wt 175.0 lb

## 2024-06-28 DIAGNOSIS — M2550 Pain in unspecified joint: Secondary | ICD-10-CM

## 2024-06-28 MED ORDER — CELECOXIB 100 MG PO CAPS: 100.0000 mg | ORAL_CAPSULE | Freq: Two times a day (BID) | ORAL | 2 refills | Status: DC

## 2024-06-28 NOTE — Progress Notes (Signed)
 BP 132/73   Pulse 81   Temp (!) 97.4 F (36.3 C)   Ht 5' 5 (1.651 m)   Wt 175 lb (79.4 kg)   SpO2 96%   BMI 29.12 kg/m    Subjective:   Patient ID: GEVORK AYYAD, male    DOB: 1961/05/29, 63 y.o.   MRN: [id number]  HPI: ZALE MARCOTTE is a 63 y.o. male presenting on 06/28/2024 for Hand Pain (And swelling. Unable to grip.) and Neck Pain (Radiates to back and hands)   Discussed the use of AI scribe software for clinical note transcription with the patient, who gave verbal consent to proceed.  History of Present Illness   MALIKHI OGAN is a 63 year old male who presents with recurrent neck and hand pain.  He experiences persistent pain in his neck and hands, which is most severe upon waking in the morning. The neck pain is described as so intense that he 'can't turn' his neck, and he is unable to make a fist due to the hand pain. This issue has been recurring, and this is the third visit for the same problem. He completed a short course of prednisone , which provided relief from the symptoms. He took the last dose of prednisone  on Tuesday, and since then, the symptoms have resolved.  He mentions fluctuations in his blood sugar levels, which he monitors daily. His blood sugar dropped to 54 on Monday, increased to 74 on Tuesday, 112 on Wednesday, and 154 subsequently. After taking prednisone , his blood sugar rose to 199. He notes that eating a candy bar helped increase his blood sugar when it was low.  He has a history of gout and has been prescribed indomethacin  for it, but he has not taken it recently as he only uses it when he feels a gout flare coming on. He is currently not experiencing any gout symptoms.  No current neck pain today, and the pain is not constant, occurring primarily in the morning after waking.          Relevant past medical, surgical, family and social history reviewed and updated as indicated. Interim medical history since our last visit  reviewed. Allergies and medications reviewed and updated.  Review of Systems  Constitutional:  Negative for chills and fever.  Eyes:  Negative for visual disturbance.  Respiratory:  Negative for shortness of breath and wheezing.   Cardiovascular:  Negative for chest pain and leg swelling.  Musculoskeletal:  Positive for arthralgias, joint swelling, neck pain and neck stiffness. Negative for back pain and gait problem.  Skin:  Negative for color change and rash.  Neurological:  Negative for dizziness.  All other systems reviewed and are negative.   Per HPI unless specifically indicated above   Allergies as of 06/28/2024       Reactions   Diclofenac  Itching        Medication List        Accurate as of June 28, 2024  2:04 PM. If you have any questions, ask your nurse or doctor.          celecoxib  100 MG capsule Commonly known as: CeleBREX  Take 1 capsule (100 mg total) by mouth 2 (two) times daily. Started by: Fonda LABOR Paraskevi Funez   ezetimibe  10 MG tablet Commonly known as: ZETIA  Take 1 tablet by mouth once daily   glipiZIDE  5 MG tablet Commonly known as: GLUCOTROL  Take 1 tablet (5 mg total) by mouth 2 (two) times daily before a meal.  indomethacin  50 MG capsule Commonly known as: INDOCIN  Take 1 capsule (50 mg total) by mouth 2 (two) times daily with a meal.   lisinopril  2.5 MG tablet Commonly known as: ZESTRIL  Take 1 tablet (2.5 mg total) by mouth daily.   metFORMIN  1000 MG tablet Commonly known as: GLUCOPHAGE  Take 1 tablet (1,000 mg total) by mouth 2 (two) times daily with a meal.   predniSONE  10 MG (21) Tbpk tablet Commonly known as: STERAPRED UNI-PAK 21 TAB Take 6 tablets on day one, 5 on day two, 4 on day three, 3 on day four, 2 on day five, and 1 on day six. Take with food.   predniSONE  20 MG tablet Commonly known as: DELTASONE  Take 1 tablet (20 mg total) by mouth in the morning and at bedtime.   triamcinolone  cream 0.1 % Commonly known as:  KENALOG  APPLY TOPICALLY TWICE DAILY   Trulicity  4.5 MG/0.5ML Soaj Generic drug: Dulaglutide  Inject 4.5 mg as directed once a week.         Objective:   BP 132/73   Pulse 81   Temp (!) 97.4 F (36.3 C)   Ht 5' 5 (1.651 m)   Wt 175 lb (79.4 kg)   SpO2 96%   BMI 29.12 kg/m   Wt Readings from Last 3 Encounters:  06/28/24 175 lb (79.4 kg)  04/19/24 172 lb (78 kg)  02/16/24 172 lb (78 kg)    Physical Exam Vitals and nursing note reviewed.  Constitutional:      General: He is not in acute distress.    Appearance: He is well-developed. He is not diaphoretic.  Eyes:     General: No scleral icterus.    Conjunctiva/sclera: Conjunctivae normal.  Neck:     Thyroid : No thyromegaly.  Musculoskeletal:        General: Normal range of motion.     Right wrist: Normal.     Left wrist: Normal.     Right hand: Normal. No swelling or deformity.     Left hand: Normal. No swelling or deformity.     Cervical back: Neck supple. No rigidity, spasms, torticollis or tenderness. Normal range of motion.  Lymphadenopathy:     Cervical: No cervical adenopathy.  Skin:    General: Skin is warm and dry.     Findings: No rash.  Neurological:     Mental Status: He is alert and oriented to person, place, and time.     Coordination: Coordination normal.  Psychiatric:        Behavior: Behavior normal.                 Assessment & Plan:   Problem List Items Addressed This Visit   None Visit Diagnoses       Polyarthralgia    -  Primary   Relevant Medications   celecoxib  (CELEBREX ) 100 MG capsule   Other Relevant Orders   Ambulatory referral to Orthopedic Surgery         Recurrent neck and hand pain and stiffness Intermittent pain and stiffness, improving with prednisone . Possible musculoskeletal issue requiring orthopedic evaluation. Prednisone  not suitable long-term due to side effects. - Refer to orthopedic specialist in [place]. - Prescribe Voltaren  once or twice daily  with food as needed.  Type 2 diabetes mellitus without complications Blood glucose fluctuating, possibly due to prednisone . Monitoring daily. - Continue daily blood glucose monitoring.          Follow up plan: Return if symptoms worsen or fail to improve.  Counseling provided for all of the vaccine components Orders Placed This Encounter  Procedures   Ambulatory referral to Orthopedic Surgery    Fonda Levins, MD [facility] 06/28/2024, 2:04 PM

## 2024-07-24 ENCOUNTER — Ambulatory Visit: Admitting: Family Medicine

## 2024-07-24 ENCOUNTER — Encounter: Payer: Self-pay | Admitting: Family Medicine

## 2024-07-24 VITALS — BP 119/79 | HR 90 | Temp 98.5°F | Ht 65.0 in | Wt 174.0 lb

## 2024-07-24 DIAGNOSIS — E1169 Type 2 diabetes mellitus with other specified complication: Secondary | ICD-10-CM

## 2024-07-24 DIAGNOSIS — E785 Hyperlipidemia, unspecified: Secondary | ICD-10-CM

## 2024-07-24 DIAGNOSIS — Z7984 Long term (current) use of oral hypoglycemic drugs: Secondary | ICD-10-CM

## 2024-07-24 DIAGNOSIS — E1159 Type 2 diabetes mellitus with other circulatory complications: Secondary | ICD-10-CM | POA: Diagnosis not present

## 2024-07-24 DIAGNOSIS — I152 Hypertension secondary to endocrine disorders: Secondary | ICD-10-CM

## 2024-07-24 LAB — BAYER DCA HB A1C WAIVED: HB A1C (BAYER DCA - WAIVED): 7.1 % — ABNORMAL HIGH (ref 4.8–5.6)

## 2024-07-24 LAB — LIPID PANEL

## 2024-07-24 NOTE — Progress Notes (Signed)
 BP 119/79   Pulse 90   Temp 98.5 F (36.9 C)   Ht 5' 5 (1.651 m)   Wt 174 lb (78.9 kg)   SpO2 96%   BMI 28.96 kg/m    Subjective:   Patient ID: Antonio Fisher, male    DOB: Dec 07, 1960, 63 y.o.   MRN: [id number]  HPI: Antonio Fisher is a 63 y.o. male presenting on 07/24/2024 for Medical Management of Chronic Issues, Diabetes, Hyperlipidemia, and Hypertension   Discussed the use of AI scribe software for clinical note transcription with the patient, who gave verbal consent to proceed.  History of Present Illness   Antonio Fisher is a 63 year old male with diabetes who presents for a recheck of his blood sugar levels.  His blood sugar levels have been fluctuating, with a high of 165 mg/dL and a low of 54 mg/dL. He has experienced hypoglycemic episodes three times recently, feeling 'a little bit shaky' during these episodes. He attributes one instance to not eating the night before. He is currently taking Trulicity , metformin , and glipizide  for diabetes management.  He mentions visiting a specialist who plans to perform an MRI on September 28th due to concerns about his neck. He reports that his hands swell, particularly when his neck hurts, and x-rays were performed recently to investigate these symptoms.  He continues to take lisinopril .          Relevant past medical, surgical, family and social history reviewed and updated as indicated. Interim medical history since our last visit reviewed. Allergies and medications reviewed and updated.  Review of Systems  Constitutional:  Negative for chills and fever.  Eyes:  Negative for visual disturbance.  Respiratory:  Negative for shortness of breath and wheezing.   Cardiovascular:  Negative for chest pain and leg swelling.  Musculoskeletal:  Positive for arthralgias, myalgias and neck pain. Negative for back pain and gait problem.  Skin:  Negative for rash.  Neurological:  Negative for dizziness and light-headedness.  All  other systems reviewed and are negative.   Per HPI unless specifically indicated above   Allergies as of 07/24/2024       Reactions   Diclofenac  Itching        Medication List        Accurate as of July 24, 2024  3:57 PM. If you have any questions, ask your nurse or doctor.          STOP taking these medications    predniSONE  10 MG (21) Tbpk tablet Commonly known as: STERAPRED UNI-PAK 21 TAB Stopped by: Antonio Fisher   predniSONE  20 MG tablet Commonly known as: DELTASONE  Stopped by: Antonio Fisher       TAKE these medications    celecoxib  100 MG capsule Commonly known as: CeleBREX  Take 1 capsule (100 mg total) by mouth 2 (two) times daily.   ezetimibe  10 MG tablet Commonly known as: ZETIA  Take 1 tablet by mouth once daily   glipiZIDE  5 MG tablet Commonly known as: GLUCOTROL  Take 1 tablet (5 mg total) by mouth 2 (two) times daily before a meal.   indomethacin  50 MG capsule Commonly known as: INDOCIN  Take 1 capsule (50 mg total) by mouth 2 (two) times daily with a meal.   lisinopril  2.5 MG tablet Commonly known as: ZESTRIL  Take 1 tablet (2.5 mg total) by mouth daily.   metFORMIN  1000 MG tablet Commonly known as: GLUCOPHAGE  Take 1 tablet (1,000 mg total) by mouth 2 (two)  times daily with a meal.   triamcinolone  cream 0.1 % Commonly known as: KENALOG  APPLY TOPICALLY TWICE DAILY   Trulicity  4.5 MG/0.5ML Soaj Generic drug: Dulaglutide  Inject 4.5 mg as directed once a week.         Objective:   BP 119/79   Pulse 90   Temp 98.5 F (36.9 C)   Ht 5' 5 (1.651 m)   Wt 174 lb (78.9 kg)   SpO2 96%   BMI 28.96 kg/m   Wt Readings from Last 3 Encounters:  07/24/24 174 lb (78.9 kg)  06/28/24 175 lb (79.4 kg)  04/19/24 172 lb (78 kg)    Physical Exam Physical Exam   VITALS: BP- 119/79 NECK: Thyroid  without nodules. CHEST: Lungs clear to auscultation bilaterally. CARDIOVASCULAR: Heart regular rate and rhythm, no  murmurs. EXTREMITIES: No edema in lower extremities. Swelling in hand knuckles.         Assessment & Plan:   Problem List Items Addressed This Visit       Cardiovascular and Mediastinum   Hypertension associated with diabetes (HCC) - Primary   Relevant Orders   Bayer DCA Hb A1c Waived   CBC with Differential/Platelet   CMP14+EGFR   Lipid panel     Endocrine   Type 2 diabetes mellitus (HCC)   Hyperlipidemia associated with type 2 diabetes mellitus (HCC)   Relevant Orders   Bayer DCA Hb A1c Waived   CBC with Differential/Platelet   CMP14+EGFR   Lipid panel      Type 2 diabetes mellitus Blood glucose fluctuates between 54-165 mg/dL. Hypoglycemia likely due to glipizide . Awaiting A1c results. - Monitor blood glucose closely. - Consider reducing glipizide  if hypoglycemia persists. - Check A1c to guide management. - A1c is much better at 7.1 continue current medicine.  Cervicalgia with possible nerve impingement Neck pain with possible nerve impingement. X-rays suggest potential bone spur. MRI ordered. - Await MRI results. - Follow up with specialist for neck and hand symptoms.  Hand osteoarthritis Hand swelling, especially in knuckles, likely due to osteoarthritis. Symptoms may be linked to cervical issues.          Follow up plan: Return in about 3 months (around 10/23/2024), or if symptoms worsen or fail to improve, for Diabetes recheck.  Counseling provided for all of the vaccine components Orders Placed This Encounter  Procedures   Bayer DCA Hb A1c Waived   CBC with Differential/Platelet   CMP14+EGFR   Lipid panel    Antonio Levins, MD [facility] 07/24/2024, 3:57 PM

## 2024-07-25 ENCOUNTER — Other Ambulatory Visit: Payer: Self-pay | Admitting: Family Medicine

## 2024-07-25 LAB — CBC WITH DIFFERENTIAL/PLATELET
Basophils Absolute: 0.1 x10E3/uL (ref 0.0–0.2)
Basos: 1 %
EOS (ABSOLUTE): 0.4 x10E3/uL (ref 0.0–0.4)
Eos: 4 %
Hematocrit: 40.3 % (ref 37.5–51.0)
Hemoglobin: 13.1 g/dL (ref 13.0–17.7)
Immature Grans (Abs): 0 x10E3/uL (ref 0.0–0.1)
Immature Granulocytes: 0 %
Lymphocytes Absolute: 1.6 x10E3/uL (ref 0.7–3.1)
Lymphs: 20 %
MCH: 27.3 pg (ref 26.6–33.0)
MCHC: 32.5 g/dL (ref 31.5–35.7)
MCV: 84 fL (ref 79–97)
Monocytes Absolute: 0.6 x10E3/uL (ref 0.1–0.9)
Monocytes: 7 %
Neutrophils Absolute: 5.5 x10E3/uL (ref 1.4–7.0)
Neutrophils: 68 %
Platelets: 292 x10E3/uL (ref 150–450)
RBC: 4.8 x10E6/uL (ref 4.14–5.80)
RDW: 15.1 % (ref 11.6–15.4)
WBC: 8.1 x10E3/uL (ref 3.4–10.8)

## 2024-07-25 LAB — CMP14+EGFR
ALT: 18 IU/L (ref 0–44)
AST: 19 IU/L (ref 0–40)
Albumin: 4 g/dL (ref 3.9–4.9)
Alkaline Phosphatase: 111 IU/L (ref 44–121)
BUN/Creatinine Ratio: 15 (ref 10–24)
BUN: 15 mg/dL (ref 8–27)
Bilirubin Total: 0.3 mg/dL (ref 0.0–1.2)
CO2: 23 mmol/L (ref 20–29)
Calcium: 9.7 mg/dL (ref 8.6–10.2)
Chloride: 104 mmol/L (ref 96–106)
Creatinine, Ser: 0.99 mg/dL (ref 0.76–1.27)
Globulin, Total: 2.4 g/dL (ref 1.5–4.5)
Glucose: 182 mg/dL — AB (ref 70–99)
Potassium: 4.6 mmol/L (ref 3.5–5.2)
Sodium: 143 mmol/L (ref 134–144)
Total Protein: 6.4 g/dL (ref 6.0–8.5)
eGFR: 86 mL/min/1.73 (ref >59)

## 2024-07-25 LAB — LIPID PANEL
Cholesterol, Total: 182 mg/dL (ref 100–199)
HDL: 43 mg/dL (ref >39)
LDL CALC COMMENT:: 4.2 ratio (ref 0.0–5.0)
LDL Chol Calc (NIH): 94 mg/dL (ref 0–99)
Triglycerides: 266 mg/dL — AB (ref 0–149)
VLDL Cholesterol Cal: 45 mg/dL — AB (ref 5–40)

## 2024-07-31 ENCOUNTER — Ambulatory Visit: Payer: Self-pay | Admitting: Family Medicine

## 2024-07-31 ENCOUNTER — Telehealth: Payer: Self-pay

## 2024-07-31 NOTE — Telephone Encounter (Signed)
 Copied from CRM [id number]. Topic: Clinical - Lab/Test Results >> Jul 31, 2024 10:32 AM Tinnie BROCKS wrote: Reason for CRM: FYI only- Misty (daughter) calling back Antonio Fisher. regarding lab results. Results relayed. No further questions.

## 2024-09-30 ENCOUNTER — Telehealth: Payer: Self-pay

## 2024-09-30 ENCOUNTER — Other Ambulatory Visit: Payer: Self-pay

## 2024-09-30 DIAGNOSIS — D352 Benign neoplasm of pituitary gland: Secondary | ICD-10-CM

## 2024-09-30 NOTE — Telephone Encounter (Signed)
 Pt dropped of MRI results of Pituitary. [facility] recommended pt follow up with endocrinology and if needed neurosurgery.  Dr. Maryanne made aware. Referral placed to endocrinology for Pituitary Microadenoma.  Daughter made aware. She was instructed to call the office back if they do not receive a phone call within 14 business days.  MRI result sent to be scanned.

## 2024-10-22 ENCOUNTER — Other Ambulatory Visit: Payer: Self-pay | Admitting: Family Medicine

## 2024-10-23 ENCOUNTER — Encounter: Payer: Self-pay | Admitting: Family Medicine

## 2024-10-23 ENCOUNTER — Ambulatory Visit: Payer: Self-pay | Admitting: Family Medicine

## 2024-10-23 VITALS — BP 131/76 | HR 78 | Ht 65.0 in | Wt 184.0 lb

## 2024-10-23 DIAGNOSIS — I152 Hypertension secondary to endocrine disorders: Secondary | ICD-10-CM

## 2024-10-23 DIAGNOSIS — D352 Benign neoplasm of pituitary gland: Secondary | ICD-10-CM | POA: Insufficient documentation

## 2024-10-23 DIAGNOSIS — M2550 Pain in unspecified joint: Secondary | ICD-10-CM

## 2024-10-23 DIAGNOSIS — E1169 Type 2 diabetes mellitus with other specified complication: Secondary | ICD-10-CM

## 2024-10-23 LAB — BAYER DCA HB A1C WAIVED: HB A1C (BAYER DCA - WAIVED): 6 % — ABNORMAL HIGH (ref 4.8–5.6)

## 2024-10-23 MED ORDER — MELOXICAM 15 MG PO TABS: 15.0000 mg | ORAL_TABLET | Freq: Every day | ORAL | 0 refills | Status: AC

## 2024-10-23 NOTE — Progress Notes (Signed)
 BP 131/76   Pulse 78   Ht 5' 5 (1.651 m)   Wt 184 lb (83.5 kg)   SpO2 96%   BMI 30.62 kg/m    Subjective:   Patient ID: Antonio Fisher, male    DOB: 01/01/1961, 63 y.o.   MRN: [id number]  HPI: Antonio Fisher is a 63 y.o. male presenting on 10/23/2024 for Medical Management of Chronic Issues, Diabetes, Hypertension, Cyst (Brain-wants removed), and Needs clearance to wear respirator at work   Discussed the use of AI scribe software for clinical note transcription with the patient, who gave verbal consent to proceed.  History of Present Illness   Antonio Fisher is a 63 year old male with diabetes who presents with hand swelling.  Hand swelling - Significant swelling in both hands, severe enough to prevent holding objects and performing work duties - Swelling previously managed with prednisone , which worsened glycemic control - Recent blood glucose measured at 145 mg/dL, slightly elevated compared to baseline - Celebrex  100 mg twice daily and indomethacin  were trialed without relief - No hand swelling for the past four weeks and no prednisone  use during this period - Impaired ability to work due to hand symptoms  Diabetes mellitus - Currently managed with Trulicity , glipizide , and metformin  - Blood glucose control negatively affected by prednisone  use - Recent blood glucose reading of 145 mg/dL - Diabetes has impacted ability to wear a respirator at work  Pituitary lesion - Pituitary adenoma or cyst identified on brain imaging - CT scan and MRI performed for evaluation - Concern for possible impact on vision  Occupational impact - Unable to wear a respirator at work due to diabetes - Work performance affected by hand swelling - Employed for 44 years and considering retirement at the beginning of the year          Relevant past medical, surgical, family and social history reviewed and updated as indicated. Interim medical history since our last visit  reviewed. Allergies and medications reviewed and updated.  Review of Systems  Constitutional:  Negative for chills and fever.  Eyes:  Negative for visual disturbance.  Respiratory:  Negative for shortness of breath and wheezing.   Cardiovascular:  Negative for chest pain and leg swelling.  Musculoskeletal:  Positive for arthralgias and joint swelling.  Skin:  Negative for rash.  Neurological:  Negative for dizziness and light-headedness.  All other systems reviewed and are negative.   Per HPI unless specifically indicated above   Allergies as of 10/23/2024       Reactions   Diclofenac  Itching        Medication List        Accurate as of October 23, 2024  4:26 PM. If you have any questions, ask your nurse or doctor.          STOP taking these medications    celecoxib  100 MG capsule Commonly known as: CeleBREX  Stopped by: Antonio Fisher   indomethacin  50 MG capsule Commonly known as: INDOCIN  Stopped by: Antonio Fisher       TAKE these medications    ezetimibe  10 MG tablet Commonly known as: ZETIA  Take 1 tablet by mouth once daily   glipiZIDE  5 MG tablet Commonly known as: GLUCOTROL  Take 1 tablet (5 mg total) by mouth 2 (two) times daily before a meal.   lisinopril  2.5 MG tablet Commonly known as: ZESTRIL  Take 1 tablet (2.5 mg total) by mouth daily.   meloxicam 15 MG tablet Commonly  known as: MOBIC Take 1 tablet (15 mg total) by mouth daily. Started by: Antonio Fisher   metFORMIN  1000 MG tablet Commonly known as: GLUCOPHAGE  Take 1 tablet (1,000 mg total) by mouth 2 (two) times daily with a meal.   triamcinolone  cream 0.1 % Commonly known as: KENALOG  APPLY TOPICALLY TWICE DAILY   Trulicity  4.5 MG/0.5ML Soaj Generic drug: Dulaglutide  Inject 4.5 mg as directed once a week.         Objective:   BP 131/76   Pulse 78   Ht 5' 5 (1.651 m)   Wt 184 lb (83.5 kg)   SpO2 96%   BMI 30.62 kg/m   Wt Readings from Last 3  Encounters:  10/23/24 184 lb (83.5 kg)  07/24/24 174 lb (78.9 kg)  06/28/24 175 lb (79.4 kg)    Physical Exam Physical Exam   VITALS: BP- 131/76 CHEST: Lungs clear to auscultation bilaterally. CARDIOVASCULAR: Heart regular rate and rhythm, no murmurs.         Assessment & Plan:   Problem List Items Addressed This Visit       Cardiovascular and Mediastinum   Hypertension associated with diabetes (HCC)     Endocrine   Type 2 diabetes mellitus (HCC) - Primary   Relevant Orders   Bayer DCA Hb A1c Waived   Vitamin B12   Microalbumin / creatinine urine ratio   Bayer DCA Hb A1c Waived   Hyperlipidemia associated with type 2 diabetes mellitus (HCC)   Pituitary adenoma (HCC)   Relevant Orders   Ambulatory referral to Endocrinology   Ambulatory referral to Neurosurgery   Other Visit Diagnoses       Pituitary microadenoma (HCC)         Polyarthralgia       Relevant Medications   meloxicam (MOBIC) 15 MG tablet          Type 2 diabetes mellitus with other specified complication Blood glucose elevated, likely due to prednisone  for inflammatory arthritis. Current medications include Trulicity , glipizide , and metformin . Blood pressure controlled. - Continue Trulicity , glipizide , and metformin . - Advised to contact office if swelling occurs and prednisone  is needed.  Pituitary adenoma Pituitary adenoma or cyst identified on imaging. Most are benign. Hormone testing necessary to assess for imbalances. Referral to endocrinology and neurosurgery planned. - Referred to endocrinology for hormone testing. - Referred to neurosurgery for evaluation of pituitary adenoma. - Will discuss potential surgical intervention based on hormone testing results.  Inflammatory arthritis with episodic hand swelling Episodic hand swelling due to inflammatory arthritis. Previous treatments ineffective. Prednisone  used as needed. Meloxicam proposed to reduce prednisone  use. - Prescribed meloxicam 10  mg once daily for inflammation and swelling. - Use prednisone  as needed for severe swelling, max one tablet per episode. - Advised to contact office if swelling occurs and prednisone  is needed.          Follow up plan: Return in about 3 months (around 01/21/2025), or if symptoms worsen or fail to improve, for Diabetes .  Counseling provided for all of the vaccine components Orders Placed This Encounter  Procedures   Bayer DCA Hb A1c Waived   Vitamin B12   Microalbumin / creatinine urine ratio   Bayer DCA Hb A1c Waived   Ambulatory referral to Endocrinology   Ambulatory referral to Neurosurgery    Antonio Levins, MD [facility] 10/23/2024, 4:26 PM

## 2024-10-24 LAB — MICROALBUMIN / CREATININE URINE RATIO
Creatinine, Urine: 174.9 mg/dL
Microalb/Creat Ratio: 378 mg/g{creat} — ABNORMAL HIGH (ref 0–29)
Microalbumin, Urine: 661.5 ug/mL

## 2024-10-24 LAB — VITAMIN B12: Vitamin B-12: 190 pg/mL — ABNORMAL LOW (ref 232–1245)

## 2024-10-28 ENCOUNTER — Inpatient Hospital Stay
Admission: RE | Admit: 2024-10-28 | Discharge: 2024-10-28 | Disposition: A | Payer: Self-pay | Source: Ambulatory Visit | Attending: Neurosurgery | Admitting: Neurosurgery

## 2024-10-28 ENCOUNTER — Other Ambulatory Visit: Payer: Self-pay

## 2024-10-28 DIAGNOSIS — Z049 Encounter for examination and observation for unspecified reason: Secondary | ICD-10-CM

## 2024-10-29 ENCOUNTER — Ambulatory Visit: Admitting: Neurosurgery

## 2024-10-30 ENCOUNTER — Ambulatory Visit: Payer: Self-pay | Admitting: Family Medicine

## 2024-11-22 ENCOUNTER — Ambulatory Visit: Admitting: Neurosurgery

## 2024-11-22 ENCOUNTER — Encounter: Payer: Self-pay | Admitting: Neurosurgery

## 2024-11-22 VITALS — BP 151/87 | HR 74 | Temp 98.0°F | Ht 65.0 in | Wt 186.0 lb

## 2024-11-22 DIAGNOSIS — E236 Other disorders of pituitary gland: Secondary | ICD-10-CM | POA: Diagnosis not present

## 2024-11-22 NOTE — Progress Notes (Unsigned)
 " Assessment : The patient is a 64 year old male with a history of diabetes who presents for neurosurgical consultation regarding a potential pituitary mass and a history of neck and head pain four months ago.   Following evaluation by his PCP Dr.Dettinger the patient was sent to an ortho specialist to examine the neck and back.  Imaging initially raise concerns for pituitary mass.  A CT on 08/21/2024 revealed no intracranial abnormality or definite pituitary mass.  Currently the patient is asymptomatic and has been without issues for the last 2 months.  He denies pain balance issues, coordination problems, memory loss, confusion, or any other neurological symptoms.  Furthermore, he denies nipple discharge and symptoms suggestive of acromegaly, such as changes in the fit of his rings shoes or hats.  He reports longstanding erectile dysfunction and low libido, which he attributes to diabetes.  Regarding his functional status, he reports getting tired more quickly but denies any motor weakness or current pain.  Plan : I am really glad that his neck pain has resolved.  Unfortunately I do not have access to that MRI but I was able to review the report.  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  I reviewed that diagnosis with him and his wife and I shared with them what this entails.  This is a congenital finding that very often is seen and rarely causes any problems.  On very rare occasions this can continue to enlarge and cause compression on the optic apparatus.  I shared with him that if he starts having visual problems then it needs to be a source of concern.  He sees his eye doctor frequently because of his diabetic retinopathy and I told him that if they feel that his vision is unusually worse to let me know.  I plan on getting an MRI of the brain without contrast in a year to check on this but I suspect that there will not be any significant change.  He  and his wife are comfortable with this plan and they will let me know if there is anything I can do for them in the interim.   Social History   Socioeconomic History   Marital status: Married    Spouse name: Not on file   Number of children: Not on file   Years of education: Not on file   Highest education level: 8th grade  Occupational History   Not on file  Tobacco Use   Smoking status: Former   Smokeless tobacco: Current    Types: Chew  Vaping Use   Vaping status: Never Used  Substance and Sexual Activity   Alcohol use: No    Alcohol/week: 0.0 standard drinks of alcohol   Drug use: No   Sexual activity: Not Currently  Other Topics Concern   Not on file  Social History Narrative   Not on file   Social Drivers of Health   Tobacco Use: High Risk (11/22/2024)   Patient History    Smoking Tobacco Use: Former    Smokeless Tobacco Use: Current    Passive Exposure: Not on Actuary Strain: Low Risk (10/22/2024)   Overall Financial Resource Strain (CARDIA)    Difficulty of Paying Living Expenses: Not hard at all  Food Insecurity: No Food Insecurity (10/22/2024)   Epic    Worried About Radiation Protection Practitioner of Food in the Last Year: Never true    Ran Out of Food in the Last Year: Never  true  Transportation Needs: No Transportation Needs (10/22/2024)   Epic    Lack of Transportation (Medical): No    Lack of Transportation (Non-Medical): No  Physical Activity: Insufficiently Active (10/22/2024)   Exercise Vital Sign    Days of Exercise per Week: 1 day    Minutes of Exercise per Session: 10 min  Stress: No Stress Concern Present (10/22/2024)   [employer] of Occupational Health - Occupational Stress Questionnaire    Feeling of Stress: Not at all  Social Connections: Moderately Isolated (10/22/2024)   Social Connection and Isolation Panel    Frequency of Communication with Friends and Family: More than three times a week    Frequency of Social Gatherings with Friends  and Family: More than three times a week    Attends Religious Services: Patient declined    Database Administrator or Organizations: No    Attends Engineer, Structural: Not on file    Marital Status: Married  Catering Manager Violence: Not At Risk (02/01/2024)   Humiliation, Afraid, Rape, and Kick questionnaire    Fear of Current or Ex-Partner: No    Emotionally Abused: No    Physically Abused: No    Sexually Abused: No  Depression (PHQ2-9): Low Risk (10/23/2024)   Depression (PHQ2-9)    PHQ-2 Score: 0  Alcohol Screen: Not on file  Housing: Unknown (10/22/2024)   Epic    Unable to Pay for Housing in the Last Year: No    Number of Times Moved in the Last Year: Not on file    Homeless in the Last Year: Patient declined  Utilities: Not At Risk (02/01/2024)   [facility] Utilities    Threatened with loss of utilities: No  Health Literacy: Not on file    Family History  Problem Relation Age of Onset   Diabetes Father    Heart disease Brother    Heart attack Maternal Grandmother    Early death Maternal Grandfather    Diabetes Paternal Grandmother     Allergies[1]  Past Medical History:  Diagnosis Date   Arthritis    Diabetes mellitus without complication (HCC)    Gout    History of kidney stones    Hypertension     Past Surgical History:  Procedure Laterality Date   HAND SURGERY Right    ORIF   INGUINAL HERNIA REPAIR Left 03/04/2024   Procedure: REPAIR, HERNIA, INGUINAL, ADULT;  Surgeon: Teresa Lonni HERO, MD;  Location: [facility];  Service: General;  Laterality: Left;  OPEN LEFT INGUINAL HERNIA REAPIR WITH MESH     Physical Exam   Physical Exam HENT:     Head: Normocephalic.     Nose: Nose normal.  Eyes:     Pupils: Pupils are equal, round, and reactive to light.  Cardiovascular:     Rate and Rhythm: Normal rate.  Pulmonary:     Effort: Pulmonary effort is normal.  Abdominal:     General: Abdomen is flat.  Musculoskeletal:     Cervical back: Normal range  of motion.  Neurological:     Mental Status: Patient is alert.     Cranial Nerves: Cranial nerves 2-12 are intact.     Sensory: Sensation is intact.     Motor: Motor function is intact.     Coordination: Coordination is intact.     No results found for this or any previous visit.     [1]  Allergies Allergen Reactions   Diclofenac  Itching   "

## 2024-12-03 LAB — OPHTHALMOLOGY REPORT-SCANNED

## 2025-01-23 ENCOUNTER — Ambulatory Visit: Admitting: Family Medicine

## 2025-01-28 ENCOUNTER — Ambulatory Visit: Admitting: "Endocrinology

## 2025-11-25 ENCOUNTER — Ambulatory Visit: Admitting: Neurosurgery
